# Patient Record
Sex: Male | Born: 1975 | Race: Black or African American | Hispanic: No | Marital: Single | Smoking: Never smoker
Health system: Southern US, Community
[De-identification: ages and names within clinical notes are randomized; demographics above are authoritative.]

## PROBLEM LIST (undated history)

## (undated) DIAGNOSIS — K219 Gastro-esophageal reflux disease without esophagitis: Secondary | ICD-10-CM

## (undated) DIAGNOSIS — G4733 Obstructive sleep apnea (adult) (pediatric): Secondary | ICD-10-CM

## (undated) DIAGNOSIS — I10 Essential (primary) hypertension: Secondary | ICD-10-CM

## (undated) DIAGNOSIS — M5136 Other intervertebral disc degeneration, lumbar region: Secondary | ICD-10-CM

## (undated) DIAGNOSIS — M51369 Other intervertebral disc degeneration, lumbar region without mention of lumbar back pain or lower extremity pain: Secondary | ICD-10-CM

## (undated) HISTORY — DX: Essential (primary) hypertension: I10

## (undated) HISTORY — DX: Other intervertebral disc degeneration, lumbar region: M51.36

## (undated) HISTORY — DX: Other intervertebral disc degeneration, lumbar region without mention of lumbar back pain or lower extremity pain: M51.369

## (undated) HISTORY — DX: Gastro-esophageal reflux disease without esophagitis: K21.9

## (undated) HISTORY — DX: Obstructive sleep apnea (adult) (pediatric): G47.33

---

## 2011-12-24 ENCOUNTER — Ambulatory Visit: Payer: Self-pay | Admitting: Licensed Clinical Social Worker

## 2019-02-13 ENCOUNTER — Encounter (HOSPITAL_COMMUNITY): Payer: Self-pay | Admitting: Emergency Medicine

## 2019-02-13 ENCOUNTER — Other Ambulatory Visit: Payer: Self-pay

## 2019-02-13 ENCOUNTER — Emergency Department (HOSPITAL_COMMUNITY)
Admission: EM | Admit: 2019-02-13 | Discharge: 2019-02-13 | Disposition: A | Discharge status: Home / Self Care | Payer: 59 | Attending: Emergency Medicine | Admitting: Emergency Medicine

## 2019-02-13 ENCOUNTER — Emergency Department (HOSPITAL_COMMUNITY): Payer: 59

## 2019-02-13 DIAGNOSIS — K625 Hemorrhage of anus and rectum: Secondary | ICD-10-CM | POA: Insufficient documentation

## 2019-02-13 DIAGNOSIS — R55 Syncope and collapse: Secondary | ICD-10-CM

## 2019-02-13 DIAGNOSIS — R42 Dizziness and giddiness: Secondary | ICD-10-CM | POA: Diagnosis not present

## 2019-02-13 DIAGNOSIS — Z79899 Other long term (current) drug therapy: Secondary | ICD-10-CM | POA: Insufficient documentation

## 2019-02-13 LAB — CBC WITH DIFFERENTIAL/PLATELET
Abs Immature Granulocytes: 0.04 10*3/uL (ref 0.00–0.07)
Basophils Absolute: 0 10*3/uL (ref 0.0–0.1)
Basophils Relative: 0 %
Eosinophils Absolute: 0.1 10*3/uL (ref 0.0–0.5)
Eosinophils Relative: 1 %
HCT: 41.9 % (ref 39.0–52.0)
Hemoglobin: 13.7 g/dL (ref 13.0–17.0)
Immature Granulocytes: 0 %
Lymphocytes Relative: 22 %
Lymphs Abs: 2 10*3/uL (ref 0.7–4.0)
MCH: 31.1 pg (ref 26.0–34.0)
MCHC: 32.7 g/dL (ref 30.0–36.0)
MCV: 95.2 fL (ref 80.0–100.0)
Monocytes Absolute: 0.7 10*3/uL (ref 0.1–1.0)
Monocytes Relative: 8 %
Neutro Abs: 6.4 10*3/uL (ref 1.7–7.7)
Neutrophils Relative %: 69 %
Platelets: 208 10*3/uL (ref 150–400)
RBC: 4.4 MIL/uL (ref 4.22–5.81)
RDW: 12.5 % (ref 11.5–15.5)
WBC: 9.2 10*3/uL (ref 4.0–10.5)
nRBC: 0 % (ref 0.0–0.2)

## 2019-02-13 LAB — COMPREHENSIVE METABOLIC PANEL
ALT: 35 U/L (ref 0–44)
AST: 28 U/L (ref 15–41)
Albumin: 3.8 g/dL (ref 3.5–5.0)
Alkaline Phosphatase: 61 U/L (ref 38–126)
Anion gap: 9 (ref 5–15)
BUN: 15 mg/dL (ref 6–20)
CO2: 23 mmol/L (ref 22–32)
Calcium: 9.2 mg/dL (ref 8.9–10.3)
Chloride: 105 mmol/L (ref 98–111)
Creatinine, Ser: 1.25 mg/dL — ABNORMAL HIGH (ref 0.61–1.24)
GFR calc Af Amer: >60 mL/min (ref >60)
GFR calc non Af Amer: >60 mL/min (ref >60)
Glucose, Bld: 106 mg/dL — ABNORMAL HIGH (ref 70–99)
Potassium: 4 mmol/L (ref 3.5–5.1)
Sodium: 137 mmol/L (ref 135–145)
Total Bilirubin: 0.4 mg/dL (ref 0.3–1.2)
Total Protein: 6.7 g/dL (ref 6.5–8.1)

## 2019-02-13 LAB — PROTIME-INR
INR: 1.1 (ref 0.8–1.2)
Prothrombin Time: 14.1 seconds (ref 11.4–15.2)

## 2019-02-13 LAB — LIPASE, BLOOD: Lipase: 40 U/L (ref 11–51)

## 2019-02-13 LAB — TYPE AND SCREEN
ABO/RH(D): O POS
Antibody Screen: NEGATIVE

## 2019-02-13 MED ORDER — SODIUM CHLORIDE 0.9 % IV BOLUS
1000.0000 mL | Freq: Once | INTRAVENOUS | Status: AC
  Administered 2019-02-13: 20:00:00 1000 mL via INTRAVENOUS

## 2019-02-13 MED ORDER — SODIUM CHLORIDE 0.9 % IV SOLN: INTRAVENOUS | Status: DC

## 2019-02-13 NOTE — ED Notes (Signed)
Patient given ice chips, ok per MD.

## 2019-02-13 NOTE — Discharge Instructions (Signed)
As discussed, today's evaluation demonstrated generally reassuring labs.  The most likely cause of your episode of passing out is inappropriate stimulation of your vagus nerve: Vasovagal syncope. There is currently no evidence for ongoing strain of your heart, nor abnormal heart rhythm.  It is important that you follow-up with your gastroenterologist tomorrow via telephone to discuss today's emergency department evaluation.  Please discuss this diagnosis, and your labs, the relevant one is listed below. Return here for concerning changes in your condition.  Hemoglobin 13.0 - 17.0 g/dL 13.7

## 2019-02-13 NOTE — ED Provider Notes (Signed)
[facility] DEPT Provider Note   CSN: [id number] Arrival date & time: 02/13/19  1834     History Chief Complaint  Patient presents with  . Rectal Bleeding  . Loss of Consciousness    Travis Turner is a 44 y.o. male.  HPI    Patient presents after an episode of syncope. Notably, patient had colonoscopy performed 2 days ago.  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  He notes that he had at least 10 polyps removed during his procedure, is scheduled to go back for more in a few months.  He is unaware of any pathology reports from this.  He notes that since the procedure he has had episodes of rectal bleeding, both with and without defecation.  Today,, and after a bowel movement the patient had an episode of lightheadedness, subsequently lost consciousness. He woke, with quick return to his faculties, without ongoing bleeding, without abdominal pain.  There may have been some chest pain, none currently, however. He states that he is generally well aside from this family history, takes no medication regularly, has no history of similar syncope. History reviewed. No pertinent past medical history.  There are no problems to display for this patient.   History reviewed. No pertinent surgical history.     No family history on file.  Social History   Tobacco Use  . Smoking status: Not on file  Substance Use Topics  . Alcohol use: Not on file  . Drug use: Not on file    Home Medications Prior to Admission medications   Medication Sig Start Date End Date Taking? Authorizing Provider  bismuth subsalicylate (PEPTO BISMOL) 262 MG/15ML suspension Take 30 mLs by mouth every 6 (six) hours as needed for diarrhea or loose stools.   Yes [provider]  VITAMIN D PO Take 1 capsule by mouth daily.   Yes [provider]    Allergies    Patient has no known allergies.  Review of Systems   Review of Systems  Constitutional:       Per  HPI, otherwise negative  HENT:       Per HPI, otherwise negative  Respiratory:       Per HPI, otherwise negative  Cardiovascular:       Per HPI, otherwise negative  Gastrointestinal: Positive for anal bleeding and blood in stool. Negative for abdominal pain and vomiting.  Endocrine:       Negative aside from HPI  Genitourinary:       Neg aside from HPI   Musculoskeletal:       Per HPI, otherwise negative  Skin: Negative.   Neurological: Positive for syncope. Negative for weakness.    Physical Exam Updated Vital Signs BP 114/75   Pulse (!) 115   Temp 97.9 F (36.6 C) (Oral)   Resp 17   SpO2 100%   Physical Exam Vitals and nursing note reviewed.  Constitutional:      General: He is not in acute distress.    Appearance: He is well-developed.  HENT:     Head: Normocephalic and atraumatic.  Eyes:     Conjunctiva/sclera: Conjunctivae normal.  Cardiovascular:     Rate and Rhythm: Regular rhythm. Tachycardia present.  Pulmonary:     Effort: Pulmonary effort is normal. No respiratory distress.     Breath sounds: No stridor.  Abdominal:     General: There is no distension.     Tenderness: There is no abdominal tenderness. There is no  guarding.  Skin:    General: Skin is warm and dry.  Neurological:     Mental Status: He is alert and oriented to person, place, and time.     ED Results / Procedures / Treatments   Labs (all labs ordered are listed, but only abnormal results are displayed) Labs Reviewed  COMPREHENSIVE METABOLIC PANEL - Abnormal; Notable for the following components:      Result Value   Glucose, Bld 106 (*)    Creatinine, Ser 1.25 (*)    All other components within normal limits  LIPASE, BLOOD  CBC WITH DIFFERENTIAL/PLATELET  PROTIME-INR  TYPE AND SCREEN    EKG EKG Interpretation  Date/Time:  Sunday February 13 2019 19:57:14 EST Ventricular Rate:  104 PR Interval:    QRS Duration: 89 QT Interval:  306 QTC Calculation: 403 R Axis:   74 Text  Interpretation: Sinus tachycardia Abnormal ECG Confirmed by Carmin Muskrat [phone]) on 02/13/2019 8:03:10 PM   Radiology DG Chest Port 1 View  Result Date: 02/13/2019 CLINICAL DATA:  Rectal bleeding.  Syncope. EXAM: PORTABLE CHEST 1 VIEW COMPARISON:  None. FINDINGS: The heart size and mediastinal contours are within normal limits. Both lungs are clear. The visualized skeletal structures are unremarkable. IMPRESSION: No active disease. Electronically Signed   By: Constance Holster M.D.   On: 02/13/2019 20:09    Procedures Procedures (including critical care time)  Medications Ordered in ED Medications  sodium chloride 0.9 % bolus 1,000 mL (1,000 mLs Intravenous New Bag/Given (Non-Interop) 02/13/19 2005)    And  0.9 %  sodium chloride infusion (has no administration in time range)    ED Course  I have reviewed the triage vital signs and the nursing notes.  Pertinent labs & imaging results that were available during my care of the patient were reviewed by me and considered in my medical decision making (see chart for details).    MDM Rules/Calculators/A&P                      9:50 PM On repeat exam the patient is in no distress, awake, alert, sitting upright, speaking clearly, without any increased work of breathing, with no complaints.  We discussed all findings at length, including labs notable for mild elevation in creatinine, no substantial hemoglobin drop, no substantial x-ray abnormalities, reassuring EKG, no evidence for ischemia, arrhythmia.  There is suspicion for vagal episode given the patient's description of syncope, without preceding chest pain, and ongoing mild blood loss from his recent colonoscopy. Absent abdominal pain, fever, leukocytosis, low suspicion for perforation.  No evidence for atypical ACS.  We discussed admission for monitoring versus discharge, the latter to which the patient states that he is comfortable.  Line he understands importance of following up with  his GI doctor, will call tomorrow for appropriate ongoing care. Final Clinical Impression(s) / ED Diagnoses Final diagnoses:  Syncope      Carmin Muskrat, MD 02/13/19 2156

## 2019-02-13 NOTE — ED Triage Notes (Signed)
Per EMS, patient from home, c/o rectal bleeding since last night with BM and syncopal episode today. Colonoscopy on 1/8 with polyp removal.

## 2019-02-14 LAB — ABO/RH: ABO/RH(D): O POS

## 2021-04-17 LAB — HM COLONOSCOPY

## 2021-06-07 ENCOUNTER — Emergency Department (HOSPITAL_COMMUNITY): Payer: 59

## 2021-06-07 ENCOUNTER — Emergency Department (HOSPITAL_COMMUNITY)
Admission: EM | Admit: 2021-06-07 | Discharge: 2021-06-07 | Disposition: A | Discharge status: Home / Self Care | Payer: 59 | Attending: Emergency Medicine | Admitting: Emergency Medicine

## 2021-06-07 DIAGNOSIS — N3001 Acute cystitis with hematuria: Secondary | ICD-10-CM | POA: Diagnosis not present

## 2021-06-07 DIAGNOSIS — N451 Epididymitis: Secondary | ICD-10-CM | POA: Diagnosis not present

## 2021-06-07 DIAGNOSIS — R109 Unspecified abdominal pain: Secondary | ICD-10-CM | POA: Diagnosis present

## 2021-06-07 LAB — CBC WITH DIFFERENTIAL/PLATELET
Abs Immature Granulocytes: 0.03 10*3/uL (ref 0.00–0.07)
Basophils Absolute: 0 10*3/uL (ref 0.0–0.1)
Basophils Relative: 0 %
Eosinophils Absolute: 0.1 10*3/uL (ref 0.0–0.5)
Eosinophils Relative: 1 %
HCT: 48.4 % (ref 39.0–52.0)
Hemoglobin: 16.1 g/dL (ref 13.0–17.0)
Immature Granulocytes: 0 %
Lymphocytes Relative: 13 %
Lymphs Abs: 1.3 10*3/uL (ref 0.7–4.0)
MCH: 31 pg (ref 26.0–34.0)
MCHC: 33.3 g/dL (ref 30.0–36.0)
MCV: 93.1 fL (ref 80.0–100.0)
Monocytes Absolute: 1 10*3/uL (ref 0.1–1.0)
Monocytes Relative: 10 %
Neutro Abs: 7.8 10*3/uL — ABNORMAL HIGH (ref 1.7–7.7)
Neutrophils Relative %: 76 %
Platelets: 233 10*3/uL (ref 150–400)
RBC: 5.2 MIL/uL (ref 4.22–5.81)
RDW: 12.5 % (ref 11.5–15.5)
WBC: 10.3 10*3/uL (ref 4.0–10.5)
nRBC: 0 % (ref 0.0–0.2)

## 2021-06-07 LAB — COMPREHENSIVE METABOLIC PANEL
ALT: 31 U/L (ref 0–44)
AST: 25 U/L (ref 15–41)
Albumin: 3.8 g/dL (ref 3.5–5.0)
Alkaline Phosphatase: 76 U/L (ref 38–126)
Anion gap: 9 (ref 5–15)
BUN: 15 mg/dL (ref 6–20)
CO2: 22 mmol/L (ref 22–32)
Calcium: 9.3 mg/dL (ref 8.9–10.3)
Chloride: 108 mmol/L (ref 98–111)
Creatinine, Ser: 1.37 mg/dL — ABNORMAL HIGH (ref 0.61–1.24)
GFR, Estimated: >60 mL/min (ref >60)
Glucose, Bld: 146 mg/dL — ABNORMAL HIGH (ref 70–99)
Potassium: 3.7 mmol/L (ref 3.5–5.1)
Sodium: 139 mmol/L (ref 135–145)
Total Bilirubin: 0.7 mg/dL (ref 0.3–1.2)
Total Protein: 7.5 g/dL (ref 6.5–8.1)

## 2021-06-07 LAB — URINALYSIS, ROUTINE W REFLEX MICROSCOPIC
Bilirubin Urine: NEGATIVE
Glucose, UA: NEGATIVE mg/dL
Ketones, ur: NEGATIVE mg/dL
Nitrite: NEGATIVE
Protein, ur: 30 mg/dL — AB
RBC / HPF: >50 RBC/hpf — ABNORMAL HIGH (ref 0–5)
Specific Gravity, Urine: 1.02 (ref 1.005–1.030)
WBC, UA: >50 WBC/hpf — ABNORMAL HIGH (ref 0–5)
pH: 6 (ref 5.0–8.0)

## 2021-06-07 LAB — LIPASE, BLOOD: Lipase: 40 U/L (ref 11–51)

## 2021-06-07 MED ORDER — ONDANSETRON 4 MG PO TBDP
4.0000 mg | ORAL_TABLET | Freq: Once | ORAL | Status: AC
  Administered 2021-06-07: 4 mg via ORAL
  Filled 2021-06-07: qty 1

## 2021-06-07 MED ORDER — OXYCODONE-ACETAMINOPHEN 5-325 MG PO TABS
2.0000 | ORAL_TABLET | Freq: Once | ORAL | Status: AC
  Administered 2021-06-07: 2 via ORAL
  Filled 2021-06-07: qty 2

## 2021-06-07 MED ORDER — IOHEXOL 300 MG/ML  SOLN
100.0000 mL | Freq: Once | INTRAMUSCULAR | Status: AC | PRN
Start: 2021-06-07 — End: 2021-06-07
  Administered 2021-06-07: 100 mL via INTRAVENOUS

## 2021-06-07 MED ORDER — CEFTRIAXONE SODIUM 500 MG IJ SOLR
500.0000 mg | Freq: Once | INTRAMUSCULAR | Status: DC
Start: 2021-06-07 — End: 2021-06-07

## 2021-06-07 MED ORDER — DOXYCYCLINE HYCLATE 100 MG PO TABS
100.0000 mg | ORAL_TABLET | Freq: Once | ORAL | Status: AC
  Administered 2021-06-07: 100 mg via ORAL
  Filled 2021-06-07: qty 1

## 2021-06-07 MED ORDER — SODIUM CHLORIDE 0.9 % IV SOLN
2.0000 g | Freq: Once | INTRAVENOUS | Status: AC
  Administered 2021-06-07: 2 g via INTRAVENOUS
  Filled 2021-06-07: qty 20

## 2021-06-07 MED ORDER — DOXYCYCLINE HYCLATE 100 MG PO CAPS: 100.0000 mg | ORAL_CAPSULE | Freq: Two times a day (BID) | ORAL | 0 refills | Status: AC

## 2021-06-07 NOTE — ED Provider Triage Note (Signed)
Emergency Medicine Provider Triage Evaluation Note ? ?Travis Turner , a 46 y.o. male  was evaluated in triage.  Pt complains of groin pain. Report pulling on a lawnmower cord for about 30 min yesterday afternoon.  2 hrs later report severe pain to L groin radiates to L testicle.  Did  not notice any bulge or swelling, no dysuria or hematuria ? ?Review of Systems  ?Positive: As above ?Negative: As above ? ?Physical Exam  ?BP (!) 126/91   Pulse 98   Temp 98.6 ?F (37 ?C) (Oral)   Resp 18   SpO2 92%  ?Gen:   Awake, appears uncomfortable ?Resp:  Normal effort  ?MSK:   Moves extremities without difficulty  ?Other:  Chaperone present.  Exquisite tenderness to L testicle, no swelling noted, no obvious hernia.  ? ?Medical Decision Making  ?Medically screening exam initiated at 11:39 AM.  Appropriate orders placed.  Espen Bethel was informed that the remainder of the evaluation will be completed by another provider, this initial triage assessment does not replace that evaluation, and the importance of remaining in the ED until their evaluation is complete. ? ? ?  ?Domenic Moras, PA-C ?06/07/21 1145 ? ?

## 2021-06-07 NOTE — ED Provider Notes (Signed)
?[facility] ?Provider Note ? ? ?CSN: [id number] ?Arrival date & time: 06/07/21  1053 ? ?  ? ?History ? ?Chief Complaint  ?Patient presents with  ? Abdominal Pain  ? groin  ? Dysuria  ? ? ?Federick Levene is a 46 y.o. male. ? ? ?Abdominal Pain ?Associated symptoms: dysuria   ?Dysuria ?Presenting symptoms: dysuria   ?Associated symptoms: abdominal pain   ? ?Patient is otherwise healthy 46 year old male presents to the emergency department for groin pain that started yesterday.  He then started having left testicular pain.  He report he was pulling his lawnmower cord and then in the afternoon felt worsening left groin pain that radiated to his left testicle.  He denies associated dysuria or hematuria.  Denies associated frequency.  His chief complaint does have dysuria however patient denies it.  He did report hematuria at one point.  In regards to the abdominal pain he reports more of the groin region.  Denies back pain.  Denies fever but does have nausea.  Denies vomiting or diarrhea.  Denies penile discharge.  There is a possibility of STI per patient history.  Otherwise no other complaints. ? ?Home Medications ?Prior to Admission medications   ?Medication Sig Start Date End Date Taking? Authorizing Provider  ?doxycycline (VIBRAMYCIN) 100 MG capsule Take 1 capsule (100 mg total) by mouth 2 (two) times daily for 14 days. 06/07/21 06/21/21 Yes Donnamarie Poag, MD  ?bismuth subsalicylate (PEPTO BISMOL) 262 MG/15ML suspension Take 30 mLs by mouth every 6 (six) hours as needed for diarrhea or loose stools.    [provider]  ?VITAMIN D PO Take 1 capsule by mouth daily.    [provider]  ?   ? ?Allergies    ?Patient has no known allergies.   ? ?Review of Systems   ?Review of Systems  ?Gastrointestinal:  Positive for abdominal pain.  ?Genitourinary:  Positive for dysuria.  ? ?Physical Exam ?Updated Vital Signs ?BP 124/68   Pulse 72   Temp 98.6 ?F (37 ?C) (Oral)    Resp 18   SpO2 95%  ?Physical Exam ?Vitals and nursing note reviewed.  ?Constitutional:   ?   General: He is not in acute distress. ?   Appearance: He is well-developed.  ?HENT:  ?   Head: Normocephalic and atraumatic.  ?Eyes:  ?   Conjunctiva/sclera: Conjunctivae normal.  ?Cardiovascular:  ?   Rate and Rhythm: Normal rate and regular rhythm.  ?   Heart sounds: No murmur heard. ?Pulmonary:  ?   Effort: Pulmonary effort is normal. No respiratory distress.  ?   Breath sounds: Normal breath sounds.  ?Abdominal:  ?   Palpations: Abdomen is soft.  ?   Tenderness: There is no abdominal tenderness.  ?   Hernia: No hernia is present.  ?   Comments: Obvious hernia.  No obvious inguinal hernia.  Patient is very tender near the lower groin region on the left right above his left testicle.  ?Genitourinary: ?   Penis: Normal.   ?   Testes:     ?   Left: Tenderness present.  ?   Comments: High riding left testicle as compared to the right.  No obvious hernia.  There is possible prismatic reflex on the left but not clear on repeat exam. ?Musculoskeletal:     ?   General: No swelling.  ?   Cervical back: Neck supple.  ?Skin: ?   General: Skin is warm and dry.  ?  Capillary Refill: Capillary refill takes less than 2 seconds.  ?Neurological:  ?   Mental Status: He is alert.  ?Psychiatric:     ?   Mood and Affect: Mood normal.  ? ? ?ED Results / Procedures / Treatments   ?Labs ?(all labs ordered are listed, but only abnormal results are displayed) ?Labs Reviewed  ?CBC WITH DIFFERENTIAL/PLATELET - Abnormal; Notable for the following components:  ?    Result Value  ? Neutro Abs 7.8 (*)   ? All other components within normal limits  ?COMPREHENSIVE METABOLIC PANEL - Abnormal; Notable for the following components:  ? Glucose, Bld 146 (*)   ? Creatinine, Ser 1.37 (*)   ? All other components within normal limits  ?URINALYSIS, ROUTINE W REFLEX MICROSCOPIC - Abnormal; Notable for the following components:  ? APPearance CLOUDY (*)   ? Hgb  urine dipstick LARGE (*)   ? Protein, ur 30 (*)   ? Leukocytes,Ua LARGE (*)   ? RBC / HPF >50 (*)   ? WBC, UA >50 (*)   ? Bacteria, UA RARE (*)   ? All other components within normal limits  ?URINE CULTURE  ?LIPASE, BLOOD  ?GC/CHLAMYDIA PROBE AMP () NOT AT [facility]  ? ? ?EKG ?None ? ?Radiology ?CT ABDOMEN PELVIS W CONTRAST ? ?Result Date: 06/07/2021 ?CLINICAL DATA:  LLQ abdominal pain Concern for left testicular pain that radiated to the groin region. Concern for possible hernia or torsion. EXAM: CT ABDOMEN AND PELVIS WITH CONTRAST TECHNIQUE: Multidetector CT imaging of the abdomen and pelvis was performed using the standard protocol following bolus administration of intravenous contrast. RADIATION DOSE REDUCTION: This exam was performed according to the departmental dose-optimization program which includes automated exposure control, adjustment of the mA and/or kV according to patient size and/or use of iterative reconstruction technique. CONTRAST:  164m OMNIPAQUE IOHEXOL 300 MG/ML  SOLN COMPARISON:  Scrotal ultrasound earlier today was reviewed. FINDINGS: Lower chest: Dependent and subsegmental atelectasis within both lower lobes. No significant pleural effusion. Hepatobiliary: 9 mm enhancing focus in the subcapsular right hepatic lobe, series 3, image 11. Tiny subcentimeter hypodensity in the left lobe is too small to characterize. Gallbladder physiologically distended, no calcified stone. No biliary dilatation. Pancreas: No ductal dilatation or inflammation. Spleen: Normal in size without focal abnormality. Adrenals/Urinary Tract: Normal adrenal glands. No hydronephrosis or perinephric edema. Homogeneous renal enhancement no focal renal lesion or renal stone. Urinary bladder is physiologically distended without wall thickening. Stomach/Bowel: Patulous distal esophagus. Unremarkable stomach. No small bowel obstruction or inflammation. Normal appendix. Cecum is high-riding in the right mid abdomen. Small to  moderate volume of colonic stool. There is no colonic wall thickening or pericolonic edema. Vascular/Lymphatic: Normal caliber abdominal aorta. Minimal atherosclerosis. Patent portal vein. No abdominopelvic adenopathy. Reproductive: Unremarkable prostate gland. There is hyperemia and increased vascularity of the left pampiniform plexus in the inguinal canal extending into the left hemiscrotum, suspicious for epididymitis, for example series 3, image 96 and 98. Other: No inguinal hernia.  No ascites or free air. Musculoskeletal: Lower lumbar facet hypertrophy. There are no acute or suspicious osseous abnormalities. 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 IMPRESSION: 1. Hyperemia and increased vascularity of the left pampiniform plexus in the inguinal canal extending into the left hemiscrotum, suspicious for epididymitis. 2. No inguinal hernia. 3. Enhancing 9 mm focus in the subcapsular right hepatic lobe, nonspecific. In a low risk patient this is typically benign and no further follow-up is recommended. In a high-risk patient, recommend follow-up MRI in  3-6 months. This recommendation follows the recommendations of American College of Radiology assessment of incidental liver lesions. Aortic Atherosclerosis (ICD10-I70.0). Electronically Signed   By: Keith Rake M.D.   On: 06/07/2021 17:57  ? ?US SCROTUM W/DOPPLER ? ?Result Date: 06/07/2021 ?CLINICAL DATA:  Left testicular pain. EXAM: SCROTAL ULTRASOUND DOPPLER ULTRASOUND OF THE TESTICLES TECHNIQUE: Complete ultrasound examination of the testicles, epididymis, and other scrotal structures was performed. Color and spectral Doppler ultrasound were also utilized to evaluate blood flow to the testicles. COMPARISON:  None Available. FINDINGS: Right testicle Measurements: 4.1 x 2.6 x 2.5 cm. No mass or microlithiasis visualized. Left testicle Measurements: 3.7 x 2.7 x 2.3 cm. No mass or microlithiasis visualized. Right epididymis:  Normal in size and appearance.  Left epididymis:  5 mm cyst is noted. Hydrocele:  None visualized. Varicocele:  None visualized. Pulsed Doppler interrogation of both testes demonstrates normal low resistance arterial and venous waveforms bil

## 2021-06-07 NOTE — Discharge Instructions (Addendum)
You have been evaluated for left testicular pain. ? ?Your CT is consistent with epididymitis.  Please see the attached learning form to read more about it.  Please take doxycycline for 14 days.  Follow-up with your primary care provider.  In addition your CT did show a 9 mm region on the right hepatic lobe that is unclear at this time.  You will require MRI in 3 to 6 months for further assessment.  Please see your primary care provider for follow-up. ? ?

## 2021-06-07 NOTE — ED Triage Notes (Signed)
EMS stated , pt pulling lawnmower yesterday and felt some pain in his groin and today going up into his stomach. Today hard to urinate. ?

## 2021-06-09 LAB — URINE CULTURE: Culture: NO GROWTH

## 2021-10-31 NOTE — Progress Notes (Signed)
[facility] [place] Phone: [phone] Subjective:   Travis Turner, am serving as a scribe for Dr. Hulan Saas.  I'm seeing this patient by the request  of:  [facility]  CC: Neck pain follow-up  ION:[id number]  Travis Turner is a 46 y.o. male coming in with complaint of neck pain. 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 Laying down and sleeping is the only thing that takes the pain away, but it takes a long time to get comfortable. When he is moving around and working it will get painful and the only thing that helps is stopping what he is doing. Patient was doing aleve and tylenol but got worried about taking too much because it eats the lining of his stomach. Patient has gotten a new chair put his monitor up so he is not looking down, but still around 9 or 10 am the pain will start and eventually he will have to stop. Patient states the pain will start off aching the will eventually get really bad and start throbbing this makes it feel like his head is going to explode.       No past medical history on file. No past surgical history on file. Social History   Socioeconomic History   Marital status: Single    Spouse name: Not on file   Number of children: Not on file   Years of education: Not on file   Highest education level: Not on file  Occupational History   Not on file  Tobacco Use   Smoking status: Not on file   Smokeless tobacco: Not on file  Substance and Sexual Activity   Alcohol use: Not on file   Drug use: Not on file   Sexual activity: Not on file  Other Topics Concern   Not on file  Social History Narrative   Not on file   Social  Determinants of Health   Financial Resource Strain: Not on file  Food Insecurity: Not on file  Transportation Needs: Not on file  Physical Activity: Not on file  Stress: Not on file  Social Connections: Not on file   No Known Allergies No family history on file.       Current Outpatient Medications (Other):    tiZANidine (ZANAFLEX) 2 MG tablet, Take 1 tablet (2 mg total) by mouth at bedtime.   VITAMIN D PO, Take 1 capsule by mouth daily.   bismuth subsalicylate (PEPTO BISMOL) 262 MG/15ML suspension, Take 30 mLs by mouth every 6 (six) hours as needed for diarrhea or loose stools. (Patient not taking: Reported on 11/05/2021)   Reviewed prior external information including notes and imaging from  primary care provider As well as notes that were available from care everywhere and other healthcare systems.  Past medical history, social, surgical and family history all reviewed in electronic medical record.  No pertanent information unless stated regarding to the chief complaint.   Review of Systems:  No headache, visual changes, nausea, vomiting, diarrhea, constipation, dizziness, abdominal pain, skin rash, fevers, chills, night sweats, weight loss, swollen lymph nodes, body aches, joint swelling, chest pain, shortness of breath, mood changes. POSITIVE muscle aches  Objective  Blood pressure (!) 142/98, pulse 76, height 5' 8.75" (1.746 m), weight 189 lb (85.7 kg), SpO2 97 %.   General: No apparent distress alert and oriented x3 mood and affect normal, dressed appropriately.  HEENT: Pupils equal, extraocular movements intact  Respiratory: Patient's speak in full sentences and does not appear short of breath  Cardiovascular: No lower extremity edema, non tender, no erythema  Neck exam does have some limited sidebending.  Lacks last 10 degrees of extension as well.  Mild scapular dyskinesis noted as well.  Negative Spurling's.  5 out of 5 strength noted patient does have a subungual  hematoma   Osteopathic findings C2 flexed rotated and side bent right C4 flexed rotated and side bent left C6 flexed rotated and side bent left T3 extended rotated and side bent right inhaled third rib T5 extended rotated and side bent left  [id number]; 15 additional minutes spent for Therapeutic exercises as stated in above notes.  This included exercises focusing on stretching, strengthening, with significant focus on eccentric aspects.   Long term goals include an improvement in range of motion, strength, endurance as well as avoiding reinjury. Patient's frequency would include in 1-2 times a day, 3-5 times a week for a duration of 6-12 weeks. Exercises that included:  Basic scapular stabilization to include adduction and depression of scapula Scaption, focusing on proper movement and good control Internal and External rotation utilizing a theraband, with elbow tucked at side entire time Rows with theraband    Proper technique shown and discussed handout in great detail with ATC.  All questions were discussed and answered.      Impression and Recommendations:    The above documentation has been reviewed and is accurate and complete Lyndal Pulley, DO

## 2021-11-05 ENCOUNTER — Ambulatory Visit (INDEPENDENT_AMBULATORY_CARE_PROVIDER_SITE_OTHER): Payer: 59

## 2021-11-05 ENCOUNTER — Ambulatory Visit: Payer: 59 | Admitting: Family Medicine

## 2021-11-05 VITALS — BP 142/98 | HR 76 | Ht 68.75 in | Wt 189.0 lb

## 2021-11-05 DIAGNOSIS — M9901 Segmental and somatic dysfunction of cervical region: Secondary | ICD-10-CM | POA: Diagnosis not present

## 2021-11-05 DIAGNOSIS — M542 Cervicalgia: Secondary | ICD-10-CM | POA: Diagnosis not present

## 2021-11-05 DIAGNOSIS — M9908 Segmental and somatic dysfunction of rib cage: Secondary | ICD-10-CM | POA: Diagnosis not present

## 2021-11-05 DIAGNOSIS — M9902 Segmental and somatic dysfunction of thoracic region: Secondary | ICD-10-CM | POA: Diagnosis not present

## 2021-11-05 MED ORDER — TIZANIDINE HCL 2 MG PO TABS: 2.0000 mg | ORAL_TABLET | Freq: Every day | ORAL | 0 refills | Status: DC

## 2021-11-05 NOTE — Assessment & Plan Note (Addendum)
   Decision today to treat with OMT was based on Physical Exam  After verbal consent patient was treated with HVLA, ME, FPR techniques in cervical, thoracic, rib areas, all areas are chronic   Patient tolerated the procedure well with improvement in symptoms  Patient given exercises, stretches and lifestyle modifications  See medications in patient instructions if given  Patient will follow up in 4-8 weeks 

## 2021-11-05 NOTE — Assessment & Plan Note (Signed)
See to be chronic.  Seems to be with some limited sidebending.  Mostly seems to be muscular but may be have some underlying arthritic changes.  We discussed ergonomics.  Muscle relaxer given.  Discussed we could consider anti-inflammatories but patient has had difficulty with stomach issues previously.  Follow-up with me again in 6 to 8 weeks otherwise.

## 2021-11-05 NOTE — Patient Instructions (Addendum)
Good to see you Get x-rays on your way out Tried manipulation today Scapular exercises given Zanaflex 2mg  to take nightly Hands in Periferal vision Letter given for adjustable standing desk  Follow up in 5-6 weeks

## 2021-12-10 NOTE — Progress Notes (Signed)
[facility] [place] Phone: [phone] Subjective:   Rito Ehrlich, am serving as a scribe for Dr. Hulan Saas. I'm seeing this patient by the request  of:  [facility]  CC: Neck and back pain follow-up  [id number]  Kebron Pulse is a 46 y.o. male coming in with complaint of back and neck pain. OMT 11/05/2021. Patient states that OMT helped a little, patient does not try and do a lot each day because after being active for too long the neck pain will start hurting. The only thing that takes the pain away is laying down. Patient will take a muscle relaxer if he has had a long day. Wants to go over x rays.   Medications patient has been prescribed: Zanaflex  Taking:         Reviewed prior external information including notes and imaging from previsou exam, outside providers and external EMR if available.   As well as notes that were available from care everywhere and other healthcare systems.  Past medical history, social, surgical and family history all reviewed in electronic medical record.  No pertanent information unless stated regarding to the chief complaint.   No past medical history on file.  No Known Allergies   Review of Systems:  No headache, visual changes, nausea, vomiting, diarrhea, constipation, dizziness, abdominal pain, skin rash, fevers, chills, night sweats, weight loss, swollen lymph nodes, body aches, joint swelling, chest pain, shortness of breath, mood changes. POSITIVE muscle aches  Objective  Blood pressure (!) 150/100, pulse 78, height 5' 8.75" (1.746 m), weight 184 lb (83.5 kg), SpO2 97 %.   General: No apparent distress alert and oriented x3 mood and affect normal, dressed appropriately.  HEENT: Pupils equal, extraocular movements intact  Respiratory: Patient's speak in full sentences and does not appear short of breath  Cardiovascular: No lower extremity edema, non  tender, no erythema  MSK:  Back does have some loss of lordosis noted.  Neck still has loss of lordosis.  Some limited sidebending bilaterally.  Significant tightness noted.  Patient does have postsurgical changes noted of the left arm  Osteopathic findings  C3 flexed rotated and side bent right C6 flexed rotated and side bent left T3 extended rotated and side bent right inhaled rib T4 extended rotated and side bent left L2 flexed rotated and side bent right Sacrum right on right       Assessment and Plan:  Neck pain Neck pain known arthritic changes.  Doing relatively well overall.  I do believe patient will do better as long as he continues with the scapular strengthening.  Discussed icing regimen and home exercises.  Patient is not taking medications on a regular basis at the moment but we do have room for this if necessary.  Increase activity slowly.  Patient is to monitor her blood pressure on a more regular basis as well and will follow-up with his primary care provider.    Nonallopathic problems  Decision today to treat with OMT was based on Physical Exam  After verbal consent patient was treated with HVLA, ME, FPR techniques in cervical, rib, thoracic, lumbar, and sacral  areas  Patient tolerated the procedure well with improvement in symptoms  Patient given exercises, stretches and lifestyle modifications  See medications in patient instructions if given  Patient will follow up in 4-8 weeks     The above documentation has been reviewed and is accurate and complete Olevia Bowens  Tamala Julian, DO         Note: This dictation was prepared with Dragon dictation along with smaller phrase technology. Any transcriptional errors that result from this process are unintentional.

## 2021-12-12 ENCOUNTER — Ambulatory Visit: Payer: 59 | Admitting: Family Medicine

## 2021-12-12 VITALS — BP 150/100 | HR 78 | Ht 68.75 in | Wt 184.0 lb

## 2021-12-12 DIAGNOSIS — M9904 Segmental and somatic dysfunction of sacral region: Secondary | ICD-10-CM

## 2021-12-12 DIAGNOSIS — M9902 Segmental and somatic dysfunction of thoracic region: Secondary | ICD-10-CM | POA: Diagnosis not present

## 2021-12-12 DIAGNOSIS — M9901 Segmental and somatic dysfunction of cervical region: Secondary | ICD-10-CM | POA: Diagnosis not present

## 2021-12-12 DIAGNOSIS — M9903 Segmental and somatic dysfunction of lumbar region: Secondary | ICD-10-CM

## 2021-12-12 DIAGNOSIS — M9908 Segmental and somatic dysfunction of rib cage: Secondary | ICD-10-CM | POA: Diagnosis not present

## 2021-12-12 DIAGNOSIS — M542 Cervicalgia: Secondary | ICD-10-CM | POA: Diagnosis not present

## 2021-12-12 MED ORDER — TIZANIDINE HCL 2 MG PO TABS: 2.0000 mg | ORAL_TABLET | Freq: Every day | ORAL | 0 refills | Status: DC

## 2021-12-12 NOTE — Patient Instructions (Signed)
Good to see you  Overall I think the neck is going to great Zanaflex refilled Keep up with the exercises See me again in 6-8 weeks

## 2021-12-13 NOTE — Assessment & Plan Note (Addendum)
Neck pain known arthritic changes.  Doing relatively well overall.  I do believe patient will do better as long as he continues with the scapular strengthening.  Discussed icing regimen and home exercises.  Patient is not taking medications on a regular basis at the moment but we do have room for this if necessary and does have a prescription for the Zanaflex 2 mg and is encouraged to take at night..  Increase activity slowly.  Patient is to monitor her blood pressure on a more regular basis as well and will follow-up with his primary care provider.

## 2022-01-23 NOTE — Progress Notes (Signed)
[facility] [address] [place] [address] Phone: [phone] Subjective:   Travis Turner, am serving as a scribe for Dr. Antoine Primas.  I'm seeing this patient by the request  of:  [facility]  CC: Back and neck pain follow-up  [id number]  Travis Turner is a 46 y.o. male coming in with complaint of back and neck pain. OMT 12/12/2021. Patient states that he is feeling about the same as last visit. Does feel like his pain moves. Pain is more in middle of cervical spine with less radiating pain. Tries to take more breaks and rests. Uses mm relaxer on the days when he does a lot throughout the day.   Medications patient has been prescribed: Zanaflex  Taking:         Reviewed prior external information including notes and imaging from previsou exam, outside providers and external EMR if available.   As well as notes that were available from care everywhere and other healthcare systems.  Past medical history, social, surgical and family history all reviewed in electronic medical record.  No pertanent information unless stated regarding to the chief complaint.   No past medical history on file.  No Known Allergies   Review of Systems:  No headache, visual changes, nausea, vomiting, diarrhea, constipation, dizziness, abdominal pain, skin rash, fevers, chills, night sweats, weight loss, swollen lymph nodes, body aches, joint swelling, chest pain, shortness of breath, mood changes. POSITIVE muscle aches  Objective  Blood pressure (!) 132/90, pulse 89, height 5\' 8"  (1.727 m), weight 187 lb (84.8 kg), SpO2 97 %.   General: No apparent distress alert and oriented x3 mood and affect normal, dressed appropriately.  HEENT: Pupils equal, extraocular movements intact  Respiratory: Patient's speak in full sentences and does not appear short of breath  Cardiovascular: No lower extremity edema, non tender, no erythema  Neck exam does have  some loss of lordosis.  Some tightness noted musculature.  Patient does have some limited sidebending bilaterally.  Patient's left arm has healed up very well.  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  Patient does have some degenerative disc disease with seen on x-ray.  We do have different medications that could be beneficial but patient would like to avoid them.  We discussed potential formal physical therapy but patient would like to try to do the home exercises on his own and will consider formal physical therapy.  Attempted osteopathic manipulation today.  Follow-up again in 6 to 8 weeks    Nonallopathic problems  Decision today to treat with OMT was based on Physical Exam  After verbal consent patient was treated with HVLA, ME, FPR techniques in cervical, rib, thoracic,   areas  Patient tolerated the procedure well with improvement in symptoms  Patient given exercises, stretches and lifestyle modifications  See medications in patient instructions if given  Patient will follow up in 4-8 weeks    The above documentation has been reviewed and is accurate and complete , DO          Note: This dictation was prepared with Dragon dictation along with smaller phrase technology. Any transcriptional errors that result from this process are unintentional.

## 2022-01-30 ENCOUNTER — Ambulatory Visit: Payer: 59 | Admitting: Family Medicine

## 2022-01-30 VITALS — BP 132/90 | HR 89 | Ht 68.0 in | Wt 187.0 lb

## 2022-01-30 DIAGNOSIS — M9908 Segmental and somatic dysfunction of rib cage: Secondary | ICD-10-CM

## 2022-01-30 DIAGNOSIS — M9902 Segmental and somatic dysfunction of thoracic region: Secondary | ICD-10-CM

## 2022-01-30 DIAGNOSIS — M9901 Segmental and somatic dysfunction of cervical region: Secondary | ICD-10-CM | POA: Diagnosis not present

## 2022-01-30 DIAGNOSIS — M542 Cervicalgia: Secondary | ICD-10-CM | POA: Diagnosis not present

## 2022-01-30 NOTE — Assessment & Plan Note (Addendum)
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  Patient does have some degenerative disc disease with seen on x-ray.  We do have different medications that could be beneficial but patient would like to avoid them.  We discussed potential formal physical therapy but patient would like to try to do the home exercises on his own and will consider formal physical therapy.  Attempted osteopathic manipulation today.  Follow-up again in 6 to 8 weeks I did discuss the Zanaflex 2 mg at night when needed.

## 2022-01-30 NOTE — Patient Instructions (Addendum)
Great to see you Happy New Year See me again in 6 weeks

## 2022-03-12 NOTE — Progress Notes (Deleted)
  [place] Phone: [phone] Subjective:    I'm seeing this patient by the request  of:  [facility]  CC: back and neck pain   [id number]  Travis Turner is a 47 y.o. male coming in with complaint of back and neck pain. OMT on 01/30/2022. Patient states   Medications patient has been prescribed:   Taking:         Reviewed prior external information including notes and imaging from previsou exam, outside providers and external EMR if available.   As well as notes that were available from care everywhere and other healthcare systems.  Past medical history, social, surgical and family history all reviewed in electronic medical record.  No pertanent information unless stated regarding to the chief complaint.   No past medical history on file.  No Known Allergies   Review of Systems:  No headache, visual changes, nausea, vomiting, diarrhea, constipation, dizziness, abdominal pain, skin rash, fevers, chills, night sweats, weight loss, swollen lymph nodes, body aches, joint swelling, chest pain, shortness of breath, mood changes. POSITIVE muscle aches  Objective  There were no vitals taken for this visit.   General: No apparent distress alert and oriented x3 mood and affect normal, dressed appropriately.  HEENT: Pupils equal, extraocular movements intact  Respiratory: Patient's speak in full sentences and does not appear short of breath  Cardiovascular: No lower extremity edema, non tender, no erythema  Gait MSK:  Back   Osteopathic findings  C2 flexed rotated and side bent right C6 flexed rotated and side bent left T3 extended rotated and side bent right inhaled rib T9 extended rotated and side bent left L2 flexed rotated and side bent right Sacrum right on right       Assessment and Plan:  No problem-specific Assessment & Plan notes found for this encounter.     Nonallopathic problems  Decision today to treat with OMT was based on Physical Exam  After verbal consent patient was treated with HVLA, ME, FPR techniques in cervical, rib, thoracic, lumbar, and sacral  areas  Patient tolerated the procedure well with improvement in symptoms  Patient given exercises, stretches and lifestyle modifications  See medications in patient instructions if given  Patient will follow up in 4-8 weeks     The above documentation has been reviewed and is accurate and complete Lyndal Pulley, DO         Note: This dictation was prepared with Dragon dictation along with smaller phrase technology. Any transcriptional errors that result from this process are unintentional.

## 2022-03-13 ENCOUNTER — Ambulatory Visit: Payer: 59 | Admitting: Family Medicine

## 2022-04-15 ENCOUNTER — Ambulatory Visit: Payer: 59 | Admitting: Internal Medicine

## 2022-04-15 ENCOUNTER — Encounter: Payer: Self-pay | Admitting: Internal Medicine

## 2022-04-15 VITALS — BP 168/98 | HR 76 | Temp 98.0°F | Resp 16 | Ht 68.0 in | Wt 196.0 lb

## 2022-04-15 DIAGNOSIS — I10 Essential (primary) hypertension: Secondary | ICD-10-CM | POA: Diagnosis not present

## 2022-04-15 DIAGNOSIS — Z0001 Encounter for general adult medical examination with abnormal findings: Secondary | ICD-10-CM | POA: Insufficient documentation

## 2022-04-15 DIAGNOSIS — E785 Hyperlipidemia, unspecified: Secondary | ICD-10-CM

## 2022-04-15 LAB — BASIC METABOLIC PANEL
BUN: 14 mg/dL (ref 6–23)
CO2: 28 mEq/L (ref 19–32)
Calcium: 9.8 mg/dL (ref 8.4–10.5)
Chloride: 102 mEq/L (ref 96–112)
Creatinine, Ser: 1.15 mg/dL (ref 0.40–1.50)
GFR: 75.23 mL/min (ref >60.00)
Glucose, Bld: 102 mg/dL — ABNORMAL HIGH (ref 70–99)
Potassium: 3.8 mEq/L (ref 3.5–5.1)
Sodium: 139 mEq/L (ref 135–145)

## 2022-04-15 LAB — HEPATIC FUNCTION PANEL
ALT: 31 U/L (ref 0–53)
AST: 22 U/L (ref 0–37)
Albumin: 4.1 g/dL (ref 3.5–5.2)
Alkaline Phosphatase: 75 U/L (ref 39–117)
Bilirubin, Direct: 0.1 mg/dL (ref 0.0–0.3)
Total Bilirubin: 0.6 mg/dL (ref 0.2–1.2)
Total Protein: 6.9 g/dL (ref 6.0–8.3)

## 2022-04-15 LAB — LIPID PANEL
Cholesterol: 234 mg/dL — ABNORMAL HIGH (ref 0–200)
HDL: 37 mg/dL — ABNORMAL LOW (ref >39.00)
LDL Cholesterol: 169 mg/dL — ABNORMAL HIGH (ref 0–99)
NonHDL: 197
Total CHOL/HDL Ratio: 6
Triglycerides: 141 mg/dL (ref 0.0–149.0)
VLDL: 28.2 mg/dL (ref 0.0–40.0)

## 2022-04-15 LAB — PSA: PSA: 1.61 ng/mL (ref 0.10–4.00)

## 2022-04-15 LAB — TSH: TSH: 1.06 u[IU]/mL (ref 0.35–5.50)

## 2022-04-15 NOTE — Patient Instructions (Signed)
Hypertension, Adult High blood pressure (hypertension) is when the force of blood pumping through the arteries is too strong. The arteries are the blood vessels that carry blood from the heart throughout the body. Hypertension forces the heart to work harder to pump blood and may cause arteries to become narrow or stiff. Untreated or uncontrolled hypertension can lead to a heart attack, heart failure, a stroke, kidney disease, and other problems. A blood pressure reading consists of a higher number over a lower number. Ideally, your blood pressure should be below 120/80. The first ("top") number is called the systolic pressure. It is a measure of the pressure in your arteries as your heart beats. The second ("bottom") number is called the diastolic pressure. It is a measure of the pressure in your arteries as the heart relaxes. What are the causes? The exact cause of this condition is not known. There are some conditions that result in high blood pressure. What increases the risk? Certain factors may make you more likely to develop high blood pressure. Some of these risk factors are under your control, including: Smoking. Not getting enough exercise or physical activity. Being overweight. Having too much fat, sugar, calories, or salt (sodium) in your diet. Drinking too much alcohol. Other risk factors include: Having a personal history of heart disease, diabetes, high cholesterol, or kidney disease. Stress. Having a family history of high blood pressure and high cholesterol. Having obstructive sleep apnea. Age. The risk increases with age. What are the signs or symptoms? High blood pressure may not cause symptoms. Very high blood pressure (hypertensive crisis) may cause: Headache. Fast or irregular heartbeats (palpitations). Shortness of breath. Nosebleed. Nausea and vomiting. Vision changes. Severe chest pain, dizziness, and seizures. How is this diagnosed? This condition is diagnosed by  measuring your blood pressure while you are seated, with your arm resting on a flat surface, your legs uncrossed, and your feet flat on the floor. The cuff of the blood pressure monitor will be placed directly against the skin of your upper arm at the level of your heart. Blood pressure should be measured at least twice using the same arm. Certain conditions can cause a difference in blood pressure between your right and left arms. If you have a high blood pressure reading during one visit or you have normal blood pressure with other risk factors, you may be asked to: Return on a different day to have your blood pressure checked again. Monitor your blood pressure at home for 1 week or longer. If you are diagnosed with hypertension, you may have other blood or imaging tests to help your health care provider understand your overall risk for other conditions. How is this treated? This condition is treated by making healthy lifestyle changes, such as eating healthy foods, exercising more, and reducing your alcohol intake. You may be referred for counseling on a healthy diet and physical activity. Your health care provider may prescribe medicine if lifestyle changes are not enough to get your blood pressure under control and if: Your systolic blood pressure is above 130. Your diastolic blood pressure is above 80. Your personal target blood pressure may vary depending on your medical conditions, your age, and other factors. Follow these instructions at home: Eating and drinking  Eat a diet that is high in fiber and potassium, and low in sodium, added sugar, and fat. An example of this eating plan is called the DASH diet. DASH stands for Dietary Approaches to Stop Hypertension. To eat this way: Eat   plenty of fresh fruits and vegetables. Try to fill one half of your plate at each meal with fruits and vegetables. Eat whole grains, such as whole-wheat pasta, brown rice, or whole-grain bread. Fill about one  fourth of your plate with whole grains. Eat or drink low-fat dairy products, such as skim milk or low-fat yogurt. Avoid fatty cuts of meat, processed or cured meats, and poultry with skin. Fill about one fourth of your plate with lean proteins, such as fish, chicken without skin, beans, eggs, or tofu. Avoid pre-made and processed foods. These tend to be higher in sodium, added sugar, and fat. Reduce your daily sodium intake. Many people with hypertension should eat less than 1,500 mg of sodium a day. Do not drink alcohol if: Your health care provider tells you not to drink. You are pregnant, may be pregnant, or are planning to become pregnant. If you drink alcohol: Limit how much you have to: 0-1 drink a day for women. 0-2 drinks a day for men. Know how much alcohol is in your drink. In the [place] one drink equals one 12 oz bottle of beer (355 mL), one 5 oz glass of wine (148 mL), or one 1 oz glass of hard liquor (44 mL). Lifestyle  Work with your health care provider to maintain a healthy body weight or to lose weight. Ask what an ideal weight is for you. Get at least 30 minutes of exercise that causes your heart to beat faster (aerobic exercise) most days of the week. Activities may include walking, swimming, or biking. Include exercise to strengthen your muscles (resistance exercise), such as Pilates or lifting weights, as part of your weekly exercise routine. Try to do these types of exercises for 30 minutes at least 3 days a week. Do not use any products that contain nicotine or tobacco. These products include cigarettes, chewing tobacco, and vaping devices, such as e-cigarettes. If you need help quitting, ask your health care provider. Monitor your blood pressure at home as told by your health care provider. Keep all follow-up visits. This is important. Medicines Take over-the-counter and prescription medicines only as told by your health care provider. Follow directions carefully. Blood  pressure medicines must be taken as prescribed. Do not skip doses of blood pressure medicine. Doing this puts you at risk for problems and can make the medicine less effective. Ask your health care provider about side effects or reactions to medicines that you should watch for. Contact a health care provider if you: Think you are having a reaction to a medicine you are taking. Have headaches that keep coming back (recurring). Feel dizzy. Have swelling in your ankles. Have trouble with your vision. Get help right away if you: Develop a severe headache or confusion. Have unusual weakness or numbness. Feel faint. Have severe pain in your chest or abdomen. Vomit repeatedly. Have trouble breathing. These symptoms may be an emergency. Get help right away. Call 911. Do not wait to see if the symptoms will go away. Do not drive yourself to the hospital. Summary Hypertension is when the force of blood pumping through your arteries is too strong. If this condition is not controlled, it may put you at risk for serious complications. Your personal target blood pressure may vary depending on your medical conditions, your age, and other factors. For most people, a normal blood pressure is less than 120/80. Hypertension is treated with lifestyle changes, medicines, or a combination of both. Lifestyle changes include losing weight, eating a healthy,   low-sodium diet, exercising more, and limiting alcohol. This information is not intended to replace advice given to you by your health care provider. Make sure you discuss any questions you have with your health care provider. Document Revised: 11/27/2020 Document Reviewed: 11/27/2020 [employer] Patient Education  2023 [employer]  

## 2022-04-15 NOTE — Progress Notes (Unsigned)
Subjective:  Patient ID: Travis Turner, male    DOB: 25-Dec-1975  Age: 47 y.o. MRN: [id number]  CC: Annual Exam, Hypertension, and Hyperlipidemia   HPI [name] presents for a CPX and to establish.  He has a history of hypertension that is not being treated.  He is an active walker and has good endurance.  He denies chest pain, shortness of breath, diaphoresis, or edema.  History [name] has a past medical history of DDD (degenerative disc disease), lumbar, GERD (gastroesophageal reflux disease), Hypertension, and OSA (obstructive sleep apnea).   He has no past surgical history on file.   His family history includes Diabetes in his father; Hypertension in his father and mother.He reports that he has never smoked. He has never been exposed to tobacco smoke. He has never used smokeless tobacco. He reports that he does not currently use alcohol. He reports that he does not currently use drugs.  Outpatient Medications Prior to Visit  Medication Sig Dispense Refill   bismuth subsalicylate (PEPTO BISMOL) 262 MG/15ML suspension Take 30 mLs by mouth every 6 (six) hours as needed for diarrhea or loose stools.     tiZANidine (ZANAFLEX) 2 MG tablet Take 1 tablet (2 mg total) by mouth at bedtime. 90 tablet 0   VITAMIN D PO Take 1 capsule by mouth daily.     No facility-administered medications prior to visit.    ROS Review of Systems  Constitutional:  Negative for chills, diaphoresis, fatigue and fever.  HENT: Negative.    Eyes: Negative.   Respiratory:  Positive for apnea. Negative for cough, chest tightness, shortness of breath and wheezing.   Cardiovascular:  Negative for chest pain, palpitations and leg swelling.  Gastrointestinal:  Negative for abdominal pain, constipation, diarrhea, nausea and vomiting.  Endocrine: Negative.   Genitourinary: Negative.  Negative for difficulty urinating.  Musculoskeletal: Negative.   Skin: Negative.   Neurological: Negative.  Negative for dizziness,  weakness and headaches.  Hematological:  Negative for adenopathy. Does not bruise/bleed easily.  Psychiatric/Behavioral: Negative.      Objective:  BP (!) 168/98 (BP Location: Right Arm, Patient Position: Sitting, Cuff Size: Large)   Pulse 76   Temp 98 F (36.7 C) (Oral)   Resp 16   Ht 5\' 8"  (1.727 m)   Wt 196 lb (88.9 kg)   SpO2 95%   BMI 29.80 kg/m   Physical Exam Vitals reviewed.  Constitutional:      Appearance: Normal appearance. He is not ill-appearing.  HENT:     Nose: Nose normal.     Mouth/Throat:     Mouth: Mucous membranes are moist.  Eyes:     General: No scleral icterus.    Conjunctiva/sclera: Conjunctivae normal.  Cardiovascular:     Rate and Rhythm: Normal rate and regular rhythm.     Heart sounds: No murmur heard.    No gallop.     Comments: EKG- NSR, 74 bpm No LVH or Q waves Pulmonary:     Effort: Pulmonary effort is normal.     Breath sounds: No stridor. No wheezing, rhonchi or rales.  Abdominal:     General: Abdomen is flat.     Palpations: There is no mass.     Tenderness: There is no abdominal tenderness. There is no guarding.     Hernia: No hernia is present. There is no hernia in the left inguinal area or right inguinal area.  Genitourinary:    Pubic Area: No rash.  Penis: Normal and circumcised.      Testes: Normal.     Epididymis:     Right: Normal.     Left: Normal.     Prostate: Normal. Not enlarged, not tender and no nodules present.     Rectum: Normal. Guaiac result negative. No mass, tenderness, anal fissure, external hemorrhoid or internal hemorrhoid. Normal anal tone.  Musculoskeletal:        General: Normal range of motion.     Cervical back: Neck supple.     Right lower leg: No edema.     Left lower leg: No edema.  Lymphadenopathy:     Cervical: No cervical adenopathy.     Lower Body: No right inguinal adenopathy. No left inguinal adenopathy.  Skin:    General: Skin is warm and dry.     Coloration: Skin is not pale.      Findings: No rash.  Neurological:     General: No focal deficit present.     Mental Status: He is alert. Mental status is at baseline.  Psychiatric:        Mood and Affect: Mood normal.        Behavior: Behavior normal.    Lab Results  Component Value Date   WBC 4.8 04/15/2022   HGB 16.0 04/15/2022   HCT 47.8 04/15/2022   PLT 198.0 04/15/2022   GLUCOSE 102 (H) 04/15/2022   CHOL 234 (H) 04/15/2022   TRIG 141.0 04/15/2022   HDL 37.00 (L) 04/15/2022   LDLCALC 169 (H) 04/15/2022   ALT 31 04/15/2022   AST 22 04/15/2022   NA 139 04/15/2022   K 3.8 04/15/2022   CL 102 04/15/2022   CREATININE 1.15 04/15/2022   BUN 14 04/15/2022   CO2 28 04/15/2022   TSH 1.06 04/15/2022   PSA 1.61 04/15/2022   INR 1.1 02/13/2019      Assessment & Plan:   Wang was seen today for annual exam, hypertension and hyperlipidemia.  Diagnoses and all orders for this visit:  Primary hypertension- His blood pressure is not controlled.  EKG is negative for LVH.  Will check labs to screen for secondary causes and endorgan damage.  Will treat with a combination of triamterene and hydrochlorothiazide. -     Basic metabolic panel; Future -     CBC with Differential/Platelet; Future -     TSH; Future -     Urinalysis, Routine w reflex microscopic; Future -     Hepatic function panel; Future -     Aldosterone + renin activity w/ ratio; Future -     EKG 12-Lead -     Aldosterone + renin activity w/ ratio -     Hepatic function panel -     Urinalysis, Routine w reflex microscopic -     TSH -     CBC with Differential/Platelet -     Basic metabolic panel -     triamterene-hydrochlorothiazide (DYAZIDE) 37.5-25 MG capsule; Take 1 each (1 capsule total) by mouth daily.  Encounter for general adult medical examination with abnormal findings- Exam completed, labs reviewed, vaccines reviewed, cancer screenings are up-to-date, patient education was given. -     PSA; Future -     PSA  Dyslipidemia, goal  LDL below 130- Statin is not indicated. -     Lipid panel; Future -     TSH; Future -     Hepatic function panel; Future -     Hepatic function panel -  TSH -     Lipid panel   I am having [name] start on triamterene-hydrochlorothiazide. I am also having him maintain his VITAMIN D PO, bismuth subsalicylate, and tiZANidine.  Meds ordered this encounter  Medications   triamterene-hydrochlorothiazide (DYAZIDE) 37.5-25 MG capsule    Sig: Take 1 each (1 capsule total) by mouth daily.    Dispense:  90 capsule    Refill:  0     Follow-up: Return in about 3 months (around 07/16/2022).  Scarlette Calico, MD

## 2022-04-16 ENCOUNTER — Encounter: Payer: Self-pay | Admitting: Internal Medicine

## 2022-04-16 LAB — CBC WITH DIFFERENTIAL/PLATELET
Basophils Absolute: 0 10*3/uL (ref 0.0–0.1)
Basophils Relative: 0.4 % (ref 0.0–3.0)
Eosinophils Absolute: 0.1 10*3/uL (ref 0.0–0.7)
Eosinophils Relative: 2 % (ref 0.0–5.0)
HCT: 47.8 % (ref 39.0–52.0)
Hemoglobin: 16 g/dL (ref 13.0–17.0)
Lymphocytes Relative: 34 % (ref 12.0–46.0)
Lymphs Abs: 1.6 10*3/uL (ref 0.7–4.0)
MCHC: 33.4 g/dL (ref 30.0–36.0)
MCV: 92 fl (ref 78.0–100.0)
Monocytes Absolute: 0.4 10*3/uL (ref 0.1–1.0)
Monocytes Relative: 7.7 % (ref 3.0–12.0)
Neutro Abs: 2.7 10*3/uL (ref 1.4–7.7)
Neutrophils Relative %: 55.9 % (ref 43.0–77.0)
Platelets: 198 10*3/uL (ref 150.0–400.0)
RBC: 5.19 Mil/uL (ref 4.22–5.81)
RDW: 13.2 % (ref 11.5–15.5)
WBC: 4.8 10*3/uL (ref 4.0–10.5)

## 2022-04-16 LAB — URINALYSIS, ROUTINE W REFLEX MICROSCOPIC
Bilirubin Urine: NEGATIVE
Ketones, ur: NEGATIVE
Leukocytes,Ua: NEGATIVE
Nitrite: NEGATIVE
Specific Gravity, Urine: 1.025 (ref 1.000–1.030)
Urine Glucose: NEGATIVE
Urobilinogen, UA: 0.2 (ref 0.0–1.0)
pH: 6 (ref 5.0–8.0)

## 2022-04-16 MED ORDER — TRIAMTERENE-HCTZ 37.5-25 MG PO CAPS: 1.0000 | ORAL_CAPSULE | Freq: Every day | ORAL | 0 refills | Status: DC

## 2022-04-18 ENCOUNTER — Encounter: Payer: Self-pay | Admitting: Internal Medicine

## 2022-04-29 LAB — ALDOSTERONE + RENIN ACTIVITY W/ RATIO
ALDO / PRA Ratio: 1.5 Ratio (ref 0.9–28.9)
Aldosterone: 3 ng/dL
Renin Activity: 2.03 ng/mL/h (ref 0.25–5.82)

## 2022-07-14 ENCOUNTER — Encounter: Payer: Self-pay | Admitting: Internal Medicine

## 2022-07-14 ENCOUNTER — Ambulatory Visit: Payer: 59 | Admitting: Internal Medicine

## 2022-07-14 ENCOUNTER — Other Ambulatory Visit: Payer: Self-pay | Admitting: Internal Medicine

## 2022-07-14 VITALS — BP 138/84 | HR 89 | Temp 98.3°F | Resp 16 | Ht 68.0 in | Wt 198.0 lb

## 2022-07-14 DIAGNOSIS — I10 Essential (primary) hypertension: Secondary | ICD-10-CM

## 2022-07-14 DIAGNOSIS — Z114 Encounter for screening for human immunodeficiency virus [HIV]: Secondary | ICD-10-CM

## 2022-07-14 DIAGNOSIS — R310 Gross hematuria: Secondary | ICD-10-CM | POA: Diagnosis not present

## 2022-07-14 DIAGNOSIS — Z1159 Encounter for screening for other viral diseases: Secondary | ICD-10-CM

## 2022-07-14 LAB — BASIC METABOLIC PANEL
BUN: 14 mg/dL (ref 6–23)
CO2: 25 mEq/L (ref 19–32)
Calcium: 9.8 mg/dL (ref 8.4–10.5)
Chloride: 104 mEq/L (ref 96–112)
Creatinine, Ser: 1.36 mg/dL (ref 0.40–1.50)
GFR: 61.41 mL/min (ref >60.00)
Glucose, Bld: 82 mg/dL (ref 70–99)
Potassium: 3.8 mEq/L (ref 3.5–5.1)
Sodium: 140 mEq/L (ref 135–145)

## 2022-07-14 LAB — URINALYSIS, ROUTINE W REFLEX MICROSCOPIC
Bilirubin Urine: NEGATIVE
Ketones, ur: NEGATIVE
Leukocytes,Ua: NEGATIVE
Nitrite: NEGATIVE
Specific Gravity, Urine: 1.02 (ref 1.000–1.030)
Total Protein, Urine: 30 — AB
Urine Glucose: NEGATIVE
Urobilinogen, UA: 0.2 (ref 0.0–1.0)
pH: 6 (ref 5.0–8.0)

## 2022-07-14 NOTE — Patient Instructions (Signed)

## 2022-07-14 NOTE — Progress Notes (Unsigned)
Subjective:  Patient ID: Travis Turner, male    DOB: 1976-01-31  Age: 47 y.o. MRN: [id number]  CC: Hypertension   HPI Travis Turner presents for f/up ---  About 3 weeks ago he had an episode of gross, painless hematuria.  He was seen elsewhere and a UA was positive for 50 red blood cells.  He has had no recurrences.  Outpatient Medications Prior to Visit  Medication Sig Dispense Refill   bismuth subsalicylate (PEPTO BISMOL) 262 MG/15ML suspension Take 30 mLs by mouth every 6 (six) hours as needed for diarrhea or loose stools.     tiZANidine (ZANAFLEX) 2 MG tablet Take 1 tablet (2 mg total) by mouth at bedtime. 90 tablet 0   triamterene-hydrochlorothiazide (DYAZIDE) 37.5-25 MG capsule TAKE 1 EACH (1 CAPSULE TOTAL) BY MOUTH DAILY. 90 capsule 0   VITAMIN D PO Take 1 capsule by mouth daily.     No facility-administered medications prior to visit.    ROS Review of Systems  Constitutional: Negative.  Negative for diaphoresis and fatigue.  HENT: Negative.    Eyes: Negative.   Respiratory:  Negative for cough, chest tightness, shortness of breath and wheezing.   Cardiovascular:  Negative for chest pain, palpitations and leg swelling.  Gastrointestinal:  Negative for abdominal pain, constipation, diarrhea, nausea and vomiting.  Endocrine: Negative.   Genitourinary:  Positive for hematuria. Negative for difficulty urinating, dysuria, flank pain, frequency, penile discharge, scrotal swelling and testicular pain.  Musculoskeletal: Negative.   Skin: Negative.   Neurological: Negative.  Negative for dizziness and weakness.  Hematological:  Negative for adenopathy. Does not bruise/bleed easily.  Psychiatric/Behavioral: Negative.      Objective:  BP 138/84 (BP Location: Right Arm, Patient Position: Sitting, Cuff Size: Large)   Pulse 89   Temp 98.3 F (36.8 C) (Oral)   Resp 16   Ht 5\' 8"  (1.727 m)   Wt 198 lb (89.8 kg)   SpO2 95%   BMI 30.11 kg/m   BP Readings from Last 3 Encounters:   07/14/22 138/84  04/15/22 (!) 168/98  01/30/22 (!) 132/90    Wt Readings from Last 3 Encounters:  07/14/22 198 lb (89.8 kg)  04/15/22 196 lb (88.9 kg)  01/30/22 187 lb (84.8 kg)    Physical Exam Vitals reviewed.  HENT:     Mouth/Throat:     Mouth: Mucous membranes are moist.  Eyes:     General: No scleral icterus.    Conjunctiva/sclera: Conjunctivae normal.  Cardiovascular:     Rate and Rhythm: Regular rhythm.     Heart sounds: No murmur heard. Pulmonary:     Effort: Pulmonary effort is normal.     Breath sounds: No stridor. No wheezing, rhonchi or rales.  Abdominal:     General: Abdomen is flat.     Palpations: There is no mass.     Tenderness: There is no abdominal tenderness. There is no guarding.     Hernia: No hernia is present.  Musculoskeletal:        General: Normal range of motion.     Cervical back: Neck supple.     Right lower leg: No edema.     Left lower leg: No edema.  Lymphadenopathy:     Cervical: No cervical adenopathy.  Skin:    General: Skin is warm and dry.  Neurological:     General: No focal deficit present.     Mental Status: He is alert. Mental status is at baseline.  Psychiatric:  Mood and Affect: Mood normal.        Behavior: Behavior normal.     Lab Results  Component Value Date   WBC 4.8 04/15/2022   HGB 16.0 04/15/2022   HCT 47.8 04/15/2022   PLT 198.0 04/15/2022   GLUCOSE 82 07/14/2022   CHOL 234 (H) 04/15/2022   TRIG 141.0 04/15/2022   HDL 37.00 (L) 04/15/2022   LDLCALC 169 (H) 04/15/2022   ALT 31 04/15/2022   AST 22 04/15/2022   NA 140 07/14/2022   K 3.8 07/14/2022   CL 104 07/14/2022   CREATININE 1.36 07/14/2022   BUN 14 07/14/2022   CO2 25 07/14/2022   TSH 1.06 04/15/2022   PSA 1.61 04/15/2022   INR 1.1 02/13/2019    CT ABDOMEN PELVIS W CONTRAST  Result Date: 06/07/2021 CLINICAL DATA:  LLQ abdominal pain Concern for left testicular pain that radiated to the groin region. Concern for possible hernia or  torsion. EXAM: CT ABDOMEN AND PELVIS WITH CONTRAST TECHNIQUE: Multidetector CT imaging of the abdomen and pelvis was performed using the standard protocol following bolus administration of intravenous contrast. RADIATION DOSE REDUCTION: This exam was performed according to the departmental dose-optimization program which includes automated exposure control, adjustment of the mA and/or kV according to patient size and/or use of iterative reconstruction technique. CONTRAST:  OMNIPAQUE IOHEXOL 300 MG/ML  SOLN COMPARISON:  Scrotal ultrasound earlier today was reviewed. FINDINGS: Lower chest: Dependent and subsegmental atelectasis within both lower lobes. No significant pleural effusion. Hepatobiliary: 9 mm enhancing focus in the subcapsular right hepatic lobe, series 3, image 11. Tiny subcentimeter hypodensity in the left lobe is too small to characterize. Gallbladder physiologically distended, no calcified stone. No biliary dilatation. Pancreas: No ductal dilatation or inflammation. Spleen: Normal in size without focal abnormality. Adrenals/Urinary Tract: Normal adrenal glands. No hydronephrosis or perinephric edema. Homogeneous renal enhancement no focal renal lesion or renal stone. Urinary bladder is physiologically distended without wall thickening. Stomach/Bowel: Patulous distal esophagus. Unremarkable stomach. No small bowel obstruction or inflammation. Normal appendix. Cecum is high-riding in the right mid abdomen. Small to moderate volume of colonic stool. There is no colonic wall thickening or pericolonic edema. Vascular/Lymphatic: Normal caliber abdominal aorta. Minimal atherosclerosis. Patent portal vein. No abdominopelvic adenopathy. Reproductive: Unremarkable prostate gland. There is hyperemia and increased vascularity of the left pampiniform plexus in the inguinal canal extending into the left hemiscrotum, suspicious for epididymitis, for example series 3, image 96 and 98. Other: No inguinal hernia.   No ascites or free air. Musculoskeletal: Lower lumbar facet hypertrophy. There are no acute or suspicious osseous abnormalities. 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 IMPRESSION: 1. Hyperemia and increased vascularity of the left pampiniform plexus in the inguinal canal extending into the left hemiscrotum, suspicious for epididymitis. 2. No inguinal hernia. 3. Enhancing 9 mm focus in the subcapsular right hepatic lobe, nonspecific. In a low risk patient this is typically benign and no further follow-up is recommended. In a high-risk patient, recommend follow-up MRI in 3-6 months. This recommendation follows the recommendations of [employer] assessment of incidental liver lesions. Aortic Atherosclerosis (ICD10-I70.0). Electronically Signed   By: Narda Rutherford M.D.   On: 06/07/2021 17:57   US SCROTUM W/DOPPLER  Result Date: 06/07/2021 CLINICAL DATA:  Left testicular pain. EXAM: SCROTAL ULTRASOUND DOPPLER ULTRASOUND OF THE TESTICLES TECHNIQUE: Complete ultrasound examination of the testicles, epididymis, and other scrotal structures was performed. Color and spectral Doppler ultrasound were also utilized to evaluate blood flow to the testicles.  COMPARISON:  None Available. FINDINGS: Right testicle Measurements: 4.1 x 2.6 x 2.5 cm. No mass or microlithiasis visualized. Left testicle Measurements: 3.7 x 2.7 x 2.3 cm. No mass or microlithiasis visualized. Right epididymis:  Normal in size and appearance. Left epididymis:  5 mm cyst is noted. Hydrocele:  None visualized. Varicocele:  None visualized. Pulsed Doppler interrogation of both testes demonstrates normal low resistance arterial and venous waveforms bilaterally. IMPRESSION: No evidence of testicular mass or torsion is noted. 5 mm left epididymal cyst. Electronically Signed   By: Lupita Raider M.D.   On: 06/07/2021 12:54    Assessment & Plan:   Gross hematuria- Will evaluate for renal pathology (RCC and cysts) with a renal CT  with contrast. -     Urinalysis, Routine w reflex microscopic; Future -     CT RENAL STONE STUDY; Future  Primary hypertension- His blood pressure is well-controlled. -     Basic metabolic panel; Future -     Urinalysis, Routine w reflex microscopic; Future  Encounter for screening for HIV -     HIV Antibody (routine testing w rflx); Future  Need for hepatitis C screening test -     Hepatitis C antibody; Future     Follow-up: Return in about 3 months (around 10/14/2022).  Sanda Linger, MD

## 2022-07-15 LAB — HEPATITIS C ANTIBODY: Hepatitis C Ab: NONREACTIVE

## 2022-07-15 LAB — HIV ANTIBODY (ROUTINE TESTING W REFLEX): HIV 1&2 Ab, 4th Generation: NONREACTIVE

## 2022-08-03 ENCOUNTER — Encounter: Payer: Self-pay | Admitting: Emergency Medicine

## 2022-08-03 ENCOUNTER — Ambulatory Visit (INDEPENDENT_AMBULATORY_CARE_PROVIDER_SITE_OTHER): Payer: 59

## 2022-08-03 ENCOUNTER — Other Ambulatory Visit: Payer: Self-pay

## 2022-08-03 ENCOUNTER — Ambulatory Visit
Admission: EM | Admit: 2022-08-03 | Discharge: 2022-08-03 | Disposition: A | Discharge status: Home / Self Care | Payer: 59 | Attending: Internal Medicine | Admitting: Internal Medicine

## 2022-08-03 DIAGNOSIS — M79675 Pain in left toe(s): Secondary | ICD-10-CM | POA: Diagnosis not present

## 2022-08-03 MED ORDER — PREDNISONE 20 MG PO TABS: 40.0000 mg | ORAL_TABLET | Freq: Every day | ORAL | 0 refills | Status: AC

## 2022-08-03 NOTE — ED Triage Notes (Signed)
Pt here for left foot pain.  Pain present for 1.5 weeks.  Did report getting a cut on that foot but denies injury. No redness noted.  Swelling noted at great toe proximal joint.  No hx gout.

## 2022-08-03 NOTE — ED Provider Notes (Signed)
[facility]    CSN: [id number] Arrival date & time: 08/03/22  1058      History   Chief Complaint No chief complaint on file.   HPI Travis Turner is a 47 y.o. male.   Patient presents with left foot pain along the proximal portion of the left great toe.  He denies any injury to the area.  Reports that pain has been present for about 1.5 to 2 weeks.  He does state that he cut his toe on a piece of glass but pain is not present in that area.  He is not sure of his last tetanus vaccine.  Has taken Aleve for symptoms with minimal improvement.  Denies history of gout or arthritis of the foot. Denies any fever.      Past Medical History:  Diagnosis Date   DDD (degenerative disc disease), lumbar    GERD (gastroesophageal reflux disease)    Hypertension    OSA (obstructive sleep apnea)     Patient Active Problem List   Diagnosis Date Noted   Gross hematuria 07/14/2022   Primary hypertension 04/15/2022   Encounter for general adult medical examination with abnormal findings 04/15/2022   Dyslipidemia, goal LDL below 130 04/15/2022   Somatic dysfunction of spine, cervical 11/05/2021    History reviewed. No pertinent surgical history.     Home Medications    Prior to Admission medications   Medication Sig Start Date End Date Taking? Authorizing Provider  predniSONE (DELTASONE) 20 MG tablet Take 2 tablets (40 mg total) by mouth daily for 5 days. 08/03/22 08/08/22 Yes Carleigh Buccieri, Acie Fredrickson, FNP  bismuth subsalicylate (PEPTO BISMOL) 262 MG/15ML suspension Take 30 mLs by mouth every 6 (six) hours as needed for diarrhea or loose stools.    [provider]  tiZANidine (ZANAFLEX) 2 MG tablet Take 1 tablet (2 mg total) by mouth at bedtime. 12/12/21   Judi Saa, DO  triamterene-hydrochlorothiazide (DYAZIDE) 37.5-25 MG capsule TAKE 1 EACH (1 CAPSULE TOTAL) BY MOUTH DAILY. 07/14/22   Etta Grandchild, MD  VITAMIN D PO Take 1 capsule by mouth daily.    [provider]    Family History Family History  Problem Relation Age of Onset   Hypertension Mother    Diabetes Father    Hypertension Father     Social History Social History   Tobacco Use   Smoking status: Never    Passive exposure: Never   Smokeless tobacco: Never  Substance Use Topics   Alcohol use: Not Currently   Drug use: Not Currently     Allergies   Patient has no known allergies.   Review of Systems Review of Systems Per HPI  Physical Exam Triage Vital Signs ED Triage Vitals  Enc Vitals Group     BP 08/03/22 1101 128/84     Pulse Rate 08/03/22 1101 74     Resp 08/03/22 1101 18     Temp 08/03/22 1101 98 F (36.7 C)     Temp Source 08/03/22 1101 Oral     SpO2 08/03/22 1101 96 %     Weight 08/03/22 1102 192 lb (87.1 kg)     Height 08/03/22 1102 5\' 9"  (1.753 m)     Head Circumference --      Peak Flow --      Pain Score 08/03/22 1102 8     Pain Loc --      Pain Edu? --      Excl. in GC? --  No data found.  Updated Vital Signs BP 128/84   Pulse 74   Temp 98 F (36.7 C) (Oral)   Resp 18   Ht 5\' 9"  (1.753 m)   Wt 192 lb (87.1 kg)   SpO2 96%   BMI 28.35 kg/m   Visual Acuity Right Eye Distance:   Left Eye Distance:   Bilateral Distance:    Right Eye Near:   Left Eye Near:    Bilateral Near:     Physical Exam Constitutional:      General: He is not in acute distress.    Appearance: Normal appearance. He is not toxic-appearing or diaphoretic.  HENT:     Head: Normocephalic and atraumatic.  Eyes:     Extraocular Movements: Extraocular movements intact.     Conjunctiva/sclera: Conjunctivae normal.  Pulmonary:     Effort: Pulmonary effort is normal.  Feet:     Comments: Patient has tenderness to palpation to left MTP joint of the left great toe with mild swelling.  No warmth or obvious discoloration noted.  Patient does have a healing approximately 0.5 inch linear laceration present to the medial portion of the left great toe with no  surrounding erythema or purulent drainage.  Patient can wiggle toe but has pain with range of motion.  Capillary refill and pulses intact.  No other tenderness to remainder of foot or toes. Neurological:     General: No focal deficit present.     Mental Status: He is alert and oriented to person, place, and time. Mental status is at baseline.  Psychiatric:        Mood and Affect: Mood normal.        Behavior: Behavior normal.        Thought Content: Thought content normal.        Judgment: Judgment normal.      UC Treatments / Results  Labs (all labs ordered are listed, but only abnormal results are displayed) Labs Reviewed  URIC ACID    EKG   Radiology DG Foot Complete Left  Result Date: 08/03/2022 CLINICAL DATA:  Left foot pain for 1.5 weeks. Patient reports laceration to the foot but denies injury. No redness noted. Swelling noted at the great toe proximal joint. No history of gout. EXAM: LEFT FOOT - COMPLETE 3+ VIEW COMPARISON:  None Available. FINDINGS: There is no evidence of fracture or dislocation. There is no evidence of significant arthropathy or cortical erosions. Tiny plantar calcaneal enthesophyte. Soft tissues are unremarkable. IMPRESSION: Negative. Electronically Signed   By: Sherron Ales M.D.   On: 08/03/2022 11:53    Procedures Procedures (including critical care time)  Medications Ordered in UC Medications - No data to display  Initial Impression / Assessment and Plan / UC Course  I have reviewed the triage vital signs and the nursing notes.  Pertinent labs & imaging results that were available during my care of the patient were reviewed by me and considered in my medical decision making (see chart for details).     X-ray of foot was negative for any acute bony abnormality.  I highly suspect gout given patient's physical exam.  No concern for cellulitis related to laceration or septic joint at this time.  Will treat with prednisone.  No obvious  contraindications to steroid therapy noted in patient's history.  Uric acid level pending.  Advised patient to avoid high purine foods.  Laceration appears to be healing well so there is no need for closure at this time  as it occurred multiple days ago.  Recommended updating tetanus vaccine but patient declined this.  Advised strict follow-up if any symptoms persist or worsen.  Patient verbalized understanding and was agreeable with plan. Final Clinical Impressions(s) / UC Diagnoses   Final diagnoses:  Great toe pain, left     Discharge Instructions      Suspect that you have gout.  I have prescribed prednisone steroid to help alleviate this.  Take this with food to avoid stomach upset.  Please avoid high purine foods and these instructions have been attached to your discharge papers.  Uric acid levels with your blood work is pending.  Will call if it is abnormal.  Follow-up if any symptoms are persistent.     ED Prescriptions     Medication Sig Dispense Auth. Provider   predniSONE (DELTASONE) 20 MG tablet Take 2 tablets (40 mg total) by mouth daily for 5 days. 10 tablet Gustavus Bryant, [place]      PDMP not reviewed this encounter.   Gustavus Bryant, [place] 08/03/22 1250

## 2022-08-03 NOTE — Discharge Instructions (Signed)
Suspect that you have gout.  I have prescribed prednisone steroid to help alleviate this.  Take this with food to avoid stomach upset.  Please avoid high purine foods and these instructions have been attached to your discharge papers.  Uric acid levels with your blood work is pending.  Will call if it is abnormal.  Follow-up if any symptoms are persistent.

## 2022-08-04 LAB — URIC ACID: Uric Acid: 8.7 mg/dL — ABNORMAL HIGH (ref 3.8–8.4)

## 2022-08-05 ENCOUNTER — Encounter: Payer: Self-pay | Admitting: Emergency Medicine

## 2022-08-05 DIAGNOSIS — M109 Gout, unspecified: Secondary | ICD-10-CM | POA: Insufficient documentation

## 2022-08-18 ENCOUNTER — Other Ambulatory Visit: Payer: 59

## 2022-10-29 ENCOUNTER — Other Ambulatory Visit: Payer: Self-pay | Admitting: Internal Medicine

## 2022-10-29 DIAGNOSIS — I10 Essential (primary) hypertension: Secondary | ICD-10-CM

## 2023-08-03 ENCOUNTER — Other Ambulatory Visit: Payer: Self-pay | Admitting: Internal Medicine

## 2023-08-03 DIAGNOSIS — I10 Essential (primary) hypertension: Secondary | ICD-10-CM

## 2023-09-01 ENCOUNTER — Other Ambulatory Visit: Payer: Self-pay | Admitting: Internal Medicine

## 2023-09-01 DIAGNOSIS — I10 Essential (primary) hypertension: Secondary | ICD-10-CM

## 2023-09-06 ENCOUNTER — Other Ambulatory Visit: Payer: Self-pay | Admitting: Internal Medicine

## 2023-09-06 DIAGNOSIS — I10 Essential (primary) hypertension: Secondary | ICD-10-CM

## 2023-09-09 NOTE — Telephone Encounter (Signed)
 Last OV 07/14/22 Next OV not scheduled  Pt needs OV, has not been seen in > 1 year.

## 2023-12-22 ENCOUNTER — Encounter: Payer: Self-pay | Admitting: Internal Medicine

## 2023-12-22 ENCOUNTER — Ambulatory Visit (INDEPENDENT_AMBULATORY_CARE_PROVIDER_SITE_OTHER): Payer: Self-pay | Admitting: Internal Medicine

## 2023-12-22 VITALS — BP 142/104 | HR 86 | Temp 97.9°F | Resp 16 | Ht 69.0 in | Wt 196.0 lb

## 2023-12-22 DIAGNOSIS — E785 Hyperlipidemia, unspecified: Secondary | ICD-10-CM

## 2023-12-22 DIAGNOSIS — M9901 Segmental and somatic dysfunction of cervical region: Secondary | ICD-10-CM

## 2023-12-22 DIAGNOSIS — M1A0790 Idiopathic chronic gout, unspecified ankle and foot, without tophus (tophi): Secondary | ICD-10-CM

## 2023-12-22 DIAGNOSIS — R310 Gross hematuria: Secondary | ICD-10-CM

## 2023-12-22 DIAGNOSIS — Z0001 Encounter for general adult medical examination with abnormal findings: Secondary | ICD-10-CM

## 2023-12-22 DIAGNOSIS — Z125 Encounter for screening for malignant neoplasm of prostate: Secondary | ICD-10-CM

## 2023-12-22 DIAGNOSIS — I10 Essential (primary) hypertension: Secondary | ICD-10-CM

## 2023-12-22 MED ORDER — TIZANIDINE HCL 2 MG PO TABS: 2.0000 mg | ORAL_TABLET | Freq: Three times a day (TID) | ORAL | 0 refills | Status: AC | PRN

## 2023-12-22 NOTE — Progress Notes (Signed)
 Subjective:  Patient ID: Travis Turner, male    DOB: August 13, 1975  Age: 48 y.o. MRN: [id number]  CC: Annual Exam and Hypertension   HPI Travis Turner presents for a CPX and f/up ---    Discussed the use of AI scribe software for clinical note transcription with the patient, who gave verbal consent to proceed.  History of Present Illness Travis Turner is a 48 year old male with a history of gout and hypertension who presents for an annual physical exam and management of a recent gout flare.  He experienced a gout flare in October, affecting the base of both big toes and the top of the bone. The pain persists slightly, but he can now wear shoes. He has not taken any specific gout medications due to lack of insurance but has managed symptoms with Tylenol  and Aleve. He has also modified his diet by eliminating red meat, fish, and processed meats, and increased water intake.  He has a history of neck pain, which occasionally radiates down his right arm, causing weakness. This occurs sporadically, particularly during physical activities like yard work. He has not experienced any recent flare-ups in his neck.  No symptoms of high blood pressure such as headaches, blurred vision, chest pain, or shortness of breath. He is currently on blood pressure medication and water pills, which he reports are effective.  His family history is notable for his father having strokes since his last visit.     Outpatient Medications Prior to Visit  Medication Sig Dispense Refill   VITAMIN D PO Take 1 capsule by mouth daily.     bismuth subsalicylate (PEPTO BISMOL) 262 MG/15ML suspension Take 30 mLs by mouth every 6 (six) hours as needed for diarrhea or loose stools.     tiZANidine  (ZANAFLEX ) 2 MG tablet Take 1 tablet (2 mg total) by mouth at bedtime. 90 tablet 0   triamterene -hydrochlorothiazide (DYAZIDE) 37.5-25 MG capsule Take 1 each (1 capsule total) by mouth daily. TAKE 1 EACH (1 CAPSULE TOTAL) BY MOUTH DAILY.  Patient to follow up with PCP prior to future refills 30 capsule 0   No facility-administered medications prior to visit.    ROS Review of Systems  Constitutional:  Negative for appetite change, chills, diaphoresis, fatigue and fever.  HENT: Negative.    Eyes: Negative.   Respiratory: Negative.  Negative for cough, chest tightness, shortness of breath and wheezing.   Cardiovascular:  Negative for chest pain, palpitations and leg swelling.  Gastrointestinal: Negative.  Negative for abdominal pain, constipation, diarrhea, nausea and vomiting.  Genitourinary: Negative.  Negative for difficulty urinating and dysuria.  Musculoskeletal:  Positive for arthralgias and neck pain. Negative for joint swelling, myalgias and neck stiffness.  Skin: Negative.   Neurological:  Negative for dizziness, weakness and light-headedness.  Hematological:  Negative for adenopathy. Does not bruise/bleed easily.  Psychiatric/Behavioral: Negative.      Objective:  BP (!) 142/104 (BP Location: Left Arm, Patient Position: Sitting) Comment: BP (R) 132/104  Pulse 86   Temp 97.9 F (36.6 C) (Temporal)   Resp 16   Ht 5' 9 (1.753 m)   Wt 196 lb (88.9 kg)   SpO2 96%   BMI 28.94 kg/m   BP Readings from Last 3 Encounters:  12/22/23 (!) 142/104  08/03/22 128/84  07/14/22 138/84    Wt Readings from Last 3 Encounters:  12/22/23 196 lb (88.9 kg)  08/03/22 192 lb (87.1 kg)  07/14/22 198 lb (89.8 kg)    Physical Exam Vitals  reviewed.  Constitutional:      Appearance: Normal appearance.  HENT:     Nose: Nose normal.     Mouth/Throat:     Mouth: Mucous membranes are moist.  Eyes:     General: No scleral icterus.    Conjunctiva/sclera: Conjunctivae normal.  Cardiovascular:     Rate and Rhythm: Normal rate and regular rhythm.     Heart sounds: No murmur heard.    No friction rub. No gallop.     Comments: EKG--- NSR, 73 bpm No LVH, Q waves, or ST/T wave changes  Pulmonary:     Effort: Pulmonary  effort is normal.     Breath sounds: No stridor. No wheezing, rhonchi or rales.  Abdominal:     General: Abdomen is flat.     Palpations: There is no mass.     Tenderness: There is no abdominal tenderness. There is no guarding.     Hernia: No hernia is present. There is no hernia in the left inguinal area or right inguinal area.  Genitourinary:    Pubic Area: No rash.      Penis: Normal and circumcised.      Testes: Normal.     Epididymis:     Right: Normal.     Left: Normal.     Prostate: Enlarged. Not tender and no nodules present.     Rectum: Normal. Guaiac result negative. No mass, tenderness, anal fissure, external hemorrhoid or internal hemorrhoid. Normal anal tone.  Musculoskeletal:        General: Normal range of motion.     Cervical back: Normal range of motion and neck supple.     Right lower leg: No edema.     Left lower leg: No edema.  Lymphadenopathy:     Lower Body: No right inguinal adenopathy. No left inguinal adenopathy.  Skin:    General: Skin is warm and dry.  Neurological:     General: No focal deficit present.     Mental Status: He is alert. Mental status is at baseline.  Psychiatric:        Mood and Affect: Mood normal.        Behavior: Behavior normal.     Lab Results  Component Value Date   WBC 5.5 12/22/2023   HGB 15.7 12/22/2023   HCT 46.6 12/22/2023   PLT 215.0 12/22/2023   GLUCOSE 73 12/22/2023   CHOL 260 (H) 12/22/2023   TRIG 135.0 12/22/2023   HDL 34.10 (L) 12/22/2023   LDLCALC 199 (H) 12/22/2023   ALT 27 12/22/2023   AST 24 12/22/2023   NA 138 12/22/2023   K 3.9 12/22/2023   CL 101 12/22/2023   CREATININE 1.35 12/22/2023   BUN 20 12/22/2023   CO2 27 12/22/2023   TSH 1.16 12/22/2023   PSA 1.34 12/22/2023   INR 1.1 02/13/2019    DG Foot Complete Left Result Date: 08/03/2022 CLINICAL DATA:  Left foot pain for 1.5 weeks. Patient reports laceration to the foot but denies injury. No redness noted. Swelling noted at the great toe  proximal joint. No history of gout. EXAM: LEFT FOOT - COMPLETE 3+ VIEW COMPARISON:  None Available. FINDINGS: There is no evidence of fracture or dislocation. There is no evidence of significant arthropathy or cortical erosions. Tiny plantar calcaneal enthesophyte. Soft tissues are unremarkable. IMPRESSION: Negative. Electronically Signed   By: Leita Mattocks M.D.   On: 08/03/2022 11:53    The 10-year ASCVD risk score (Arnett DK, et al., 2019) is:  12.7%   Values used to calculate the score:     Age: 49 years     Clincally relevant sex: Male     Is Non-Hispanic African American: Yes     Diabetic: No     Tobacco smoker: No     Systolic Blood Pressure: 142 mmHg     Is BP treated: Yes     HDL Cholesterol: 34.1 mg/dL     Total Cholesterol: 260 mg/dL   Assessment & Plan:  Idiopathic chronic gout of foot without tophus, unspecified laterality -     Uric acid; Future -     Allopurinol ; Take 1 tablet (100 mg total) by mouth daily.  Dispense: 90 tablet; Refill: 0 -     Colchicine ; Take 1 tablet (0.6 mg total) by mouth 2 (two) times daily.  Dispense: 60 tablet; Refill: 1  Primary hypertension- He has not achieved his BP goal. -     Basic metabolic panel with GFR; Future -     CBC with Differential/Platelet; Future -     TSH; Future -     Urinalysis, Routine w reflex microscopic; Future -     Hepatic function panel; Future -     EKG 12-Lead -     Triamterene -HCTZ; Take 1 each (1 capsule total) by mouth daily. TAKE 1 EACH (1 CAPSULE TOTAL) BY MOUTH DAILY. Patient to follow up with PCP prior to future refills  Dispense: 90 capsule; Refill: 0 -     amLODIPine  Besylate; Take 1 tablet (5 mg total) by mouth daily.  Dispense: 90 tablet; Refill: 0  Dyslipidemia, goal LDL below 130- He has not achieved his LDL goal. -     Lipid panel; Future -     TSH; Future -     Hepatic function panel; Future -     Rosuvastatin  Calcium ; Take 1 tablet (20 mg total) by mouth daily.  Dispense: 90 tablet; Refill:  0  Encounter for general adult medical examination with abnormal findings -     PSA; Future  Somatic dysfunction of spine, cervical -     tiZANidine  HCl; Take 1 tablet (2 mg total) by mouth every 8 (eight) hours as needed for muscle spasms.  Dispense: 270 tablet; Refill: 0     Follow-up: Return in about 6 months (around 06/20/2024).  Debby Molt, MD

## 2023-12-22 NOTE — Patient Instructions (Signed)
 Health Maintenance, Male  Adopting a healthy lifestyle and getting preventive care are important in promoting health and wellness. Ask your health care provider about:  The right schedule for you to have regular tests and exams.  Things you can do on your own to prevent diseases and keep yourself healthy.  What should I know about diet, weight, and exercise?  Eat a healthy diet    Eat a diet that includes plenty of vegetables, fruits, low-fat dairy products, and lean protein.  Do not eat a lot of foods that are high in solid fats, added sugars, or sodium.  Maintain a healthy weight  Body mass index (BMI) is a measurement that can be used to identify possible weight problems. It estimates body fat based on height and weight. Your health care provider can help determine your BMI and help you achieve or maintain a healthy weight.  Get regular exercise  Get regular exercise. This is one of the most important things you can do for your health. Most adults should:  Exercise for at least 150 minutes each week. The exercise should increase your heart rate and make you sweat (moderate-intensity exercise).  Do strengthening exercises at least twice a week. This is in addition to the moderate-intensity exercise.  Spend less time sitting. Even light physical activity can be beneficial.  Watch cholesterol and blood lipids  Have your blood tested for lipids and cholesterol at 50 years of age, then have this test every 5 years.  You may need to have your cholesterol levels checked more often if:  Your lipid or cholesterol levels are high.  You are older than 50 years of age.  You are at high risk for heart disease.  What should I know about cancer screening?  Many types of cancers can be detected early and may often be prevented. Depending on your health history and family history, you may need to have cancer screening at various ages. This may include screening for:  Colorectal cancer.  Prostate cancer.  Skin cancer.  Lung  cancer.  What should I know about heart disease, diabetes, and high blood pressure?  Blood pressure and heart disease  High blood pressure causes heart disease and increases the risk of stroke. This is more likely to develop in people who have high blood pressure readings or are overweight.  Talk with your health care provider about your target blood pressure readings.  Have your blood pressure checked:  Every 3-5 years if you are 24-52 years of age.  Every year if you are 3 years old or older.  If you are between the ages of 60 and 72 and are a current or former smoker, ask your health care provider if you should have a one-time screening for abdominal aortic aneurysm (AAA).  Diabetes  Have regular diabetes screenings. This checks your fasting blood sugar level. Have the screening done:  Once every three years after age 66 if you are at a normal weight and have a low risk for diabetes.  More often and at a younger age if you are overweight or have a high risk for diabetes.  What should I know about preventing infection?  Hepatitis B  If you have a higher risk for hepatitis B, you should be screened for this virus. Talk with your health care provider to find out if you are at risk for hepatitis B infection.  Hepatitis C  Blood testing is recommended for:  Everyone born from [birth year] through 1967.  Anyone  with known risk factors for hepatitis C.  Sexually transmitted infections (STIs)  You should be screened each year for STIs, including gonorrhea and chlamydia, if:  You are sexually active and are younger than 50 years of age.  You are older than 50 years of age and your health care provider tells you that you are at risk for this type of infection.  Your sexual activity has changed since you were last screened, and you are at increased risk for chlamydia or gonorrhea. Ask your health care provider if you are at risk.  Ask your health care provider about whether you are at high risk for HIV. Your health care provider  may recommend a prescription medicine to help prevent HIV infection. If you choose to take medicine to prevent HIV, you should first get tested for HIV. You should then be tested every 3 months for as long as you are taking the medicine.  Follow these instructions at home:  Alcohol use  Do not drink alcohol if your health care provider tells you not to drink.  If you drink alcohol:  Limit how much you have to 0-2 drinks a day.  Know how much alcohol is in your drink. In the [place] one drink equals one 12 oz bottle of beer (355 mL), one 5 oz glass of wine (148 mL), or one 1 oz glass of hard liquor (44 mL).  Lifestyle  Do not use any products that contain nicotine or tobacco. These products include cigarettes, chewing tobacco, and vaping devices, such as e-cigarettes. If you need help quitting, ask your health care provider.  Do not use street drugs.  Do not share needles.  Ask your health care provider for help if you need support or information about quitting drugs.  General instructions  Schedule regular health, dental, and eye exams.  Stay current with your vaccines.  Tell your health care provider if:  You often feel depressed.  You have ever been abused or do not feel safe at home.  Summary  Adopting a healthy lifestyle and getting preventive care are important in promoting health and wellness.  Follow your health care provider's instructions about healthy diet, exercising, and getting tested or screened for diseases.  Follow your health care provider's instructions on monitoring your cholesterol and blood pressure.  This information is not intended to replace advice given to you by your health care provider. Make sure you discuss any questions you have with your health care provider.  Document Revised: 06/11/2020 Document Reviewed: 06/11/2020  Elsevier Patient Education  2024 [employer].

## 2023-12-23 LAB — CBC WITH DIFFERENTIAL/PLATELET
Basophils Absolute: 0 K/uL (ref 0.0–0.1)
Basophils Relative: 0.5 % (ref 0.0–3.0)
Eosinophils Absolute: 0.2 K/uL (ref 0.0–0.7)
Eosinophils Relative: 4 % (ref 0.0–5.0)
HCT: 46.6 % (ref 39.0–52.0)
Hemoglobin: 15.7 g/dL (ref 13.0–17.0)
Lymphocytes Relative: 41.6 % (ref 12.0–46.0)
Lymphs Abs: 2.3 K/uL (ref 0.7–4.0)
MCHC: 33.6 g/dL (ref 30.0–36.0)
MCV: 92 fl (ref 78.0–100.0)
Monocytes Absolute: 0.5 K/uL (ref 0.1–1.0)
Monocytes Relative: 9.8 % (ref 3.0–12.0)
Neutro Abs: 2.4 K/uL (ref 1.4–7.7)
Neutrophils Relative %: 44.1 % (ref 43.0–77.0)
Platelets: 215 K/uL (ref 150.0–400.0)
RBC: 5.07 Mil/uL (ref 4.22–5.81)
RDW: 13.3 % (ref 11.5–15.5)
WBC: 5.5 K/uL (ref 4.0–10.5)

## 2023-12-23 LAB — LIPID PANEL
Cholesterol: 260 mg/dL — ABNORMAL HIGH (ref 0–200)
HDL: 34.1 mg/dL — ABNORMAL LOW (ref >39.00)
LDL Cholesterol: 199 mg/dL — ABNORMAL HIGH (ref 0–99)
NonHDL: 226.24
Total CHOL/HDL Ratio: 8
Triglycerides: 135 mg/dL (ref 0.0–149.0)
VLDL: 27 mg/dL (ref 0.0–40.0)

## 2023-12-23 LAB — HEPATIC FUNCTION PANEL
ALT: 27 U/L (ref 0–53)
AST: 24 U/L (ref 0–37)
Albumin: 4.5 g/dL (ref 3.5–5.2)
Alkaline Phosphatase: 76 U/L (ref 39–117)
Bilirubin, Direct: 0.1 mg/dL (ref 0.0–0.3)
Total Bilirubin: 0.5 mg/dL (ref 0.2–1.2)
Total Protein: 7.8 g/dL (ref 6.0–8.3)

## 2023-12-23 LAB — BASIC METABOLIC PANEL WITH GFR
BUN: 20 mg/dL (ref 6–23)
CO2: 27 meq/L (ref 19–32)
Calcium: 9.7 mg/dL (ref 8.4–10.5)
Chloride: 101 meq/L (ref 96–112)
Creatinine, Ser: 1.35 mg/dL (ref 0.40–1.50)
GFR: 61.33 mL/min (ref >60.00)
Glucose, Bld: 73 mg/dL (ref 70–99)
Potassium: 3.9 meq/L (ref 3.5–5.1)
Sodium: 138 meq/L (ref 135–145)

## 2023-12-23 LAB — URIC ACID: Uric Acid, Serum: 10.7 mg/dL — ABNORMAL HIGH (ref 4.0–7.8)

## 2023-12-23 LAB — TSH: TSH: 1.16 u[IU]/mL (ref 0.35–5.50)

## 2023-12-23 LAB — PSA: PSA: 1.34 ng/mL (ref 0.10–4.00)

## 2023-12-23 MED ORDER — AMLODIPINE BESYLATE 5 MG PO TABS
5.0000 mg | ORAL_TABLET | Freq: Every day | ORAL | 0 refills | Status: AC
Start: 2023-12-23 — End: ?

## 2023-12-23 MED ORDER — ALLOPURINOL 100 MG PO TABS: 100.0000 mg | ORAL_TABLET | Freq: Every day | ORAL | 0 refills | Status: AC

## 2023-12-23 MED ORDER — TRIAMTERENE-HCTZ 37.5-25 MG PO CAPS: 1.0000 | ORAL_CAPSULE | Freq: Every day | ORAL | 0 refills | Status: AC

## 2023-12-23 MED ORDER — COLCHICINE 0.6 MG PO TABS: 0.6000 mg | ORAL_TABLET | Freq: Two times a day (BID) | ORAL | 1 refills | Status: AC

## 2023-12-23 MED ORDER — ROSUVASTATIN CALCIUM 20 MG PO TABS: 20.0000 mg | ORAL_TABLET | Freq: Every day | ORAL | 0 refills | Status: AC

## 2023-12-24 ENCOUNTER — Ambulatory Visit: Payer: Self-pay | Admitting: Internal Medicine

## 2023-12-24 LAB — URINALYSIS, ROUTINE W REFLEX MICROSCOPIC
Bilirubin Urine: NEGATIVE
Ketones, ur: NEGATIVE
Leukocytes,Ua: NEGATIVE
Nitrite: NEGATIVE
Specific Gravity, Urine: 1.015 (ref 1.000–1.030)
Total Protein, Urine: NEGATIVE
Urine Glucose: NEGATIVE
Urobilinogen, UA: 1 (ref 0.0–1.0)
pH: 7 (ref 5.0–8.0)

## 2023-12-25 ENCOUNTER — Telehealth: Payer: Self-pay | Admitting: *Deleted

## 2023-12-25 NOTE — Progress Notes (Unsigned)
 Care Guide Pharmacy Note  12/25/2023 Name: Travis Turner MRN: [id number] DOB: [birth year]  Referred By: Joshua Debby CROME, MD Reason for referral: Complex Care Management (Outreach to schedule referral with pharmacist )   Travis Turner is a 48 y.o. year old male who is a primary care patient of Joshua Debby CROME, MD.  Travis Turner was referred to the pharmacist for assistance related to: HTN  Will call pt back with pham schedule to verify time   Thedford Franks, CMA [facility]  [facility], [facility] Guide Direct Dial: [phone]  Fax: [phone] Website: [email or web address]

## 2023-12-29 NOTE — Progress Notes (Signed)
 Care Guide Pharmacy Note  12/29/2023 Name: Travis Turner MRN: [id number] DOB: 10-15-1975  Referred By: Joshua Debby CROME, MD Reason for referral: Complex Care Management (Outreach to schedule referral with pharmacist )   [name] is a 48 y.o. year old male who is a primary care patient of Joshua Debby CROME, MD.  Arlene Genova was referred to the pharmacist for assistance related to: HTN  Successful contact was made with the patient to discuss pharmacy services including being ready for the pharmacist to call at least 5 minutes before the scheduled appointment time and to have medication bottles and any blood pressure readings ready for review. The patient agreed to meet with the pharmacist via telephone visit on 01/13/2024  Thedford Franks, CMA [place]  [facility], [facility] Guide Direct Dial: [phone]  Fax: [phone] Website: [email or web address]

## 2024-01-13 ENCOUNTER — Other Ambulatory Visit: Payer: Self-pay | Admitting: Pharmacist

## 2024-01-13 DIAGNOSIS — E785 Hyperlipidemia, unspecified: Secondary | ICD-10-CM

## 2024-01-13 DIAGNOSIS — I10 Essential (primary) hypertension: Secondary | ICD-10-CM

## 2024-01-13 DIAGNOSIS — M1A0790 Idiopathic chronic gout, unspecified ankle and foot, without tophus (tophi): Secondary | ICD-10-CM

## 2024-01-13 NOTE — Progress Notes (Signed)
 01/13/2024 Name: Travis Turner MRN: [id number] DOB: 1975-05-19  Chief Complaint  Patient presents with   Hypertension   Hyperlipidemia   Gout   Medication Management    Travis Turner is a 48 y.o. year old male who presented for a telephone visit.   They were referred to the pharmacist by their PCP for assistance in managing hypertension, hyperlipidemia/cardiovascular risk reduction, and gout.    Subjective:  Care Team: Primary Care Provider: Joshua Debby CROME, MD ; Next Scheduled Visit: none scheduled  Medication Access/Adherence  Current Pharmacy:  [facility] [phone] [address], [place] - [address] [place] [address] Phone: [phone] Fax: [phone]   Patient reports affordability concerns with their medications: No  Patient reports access/transportation concerns to their pharmacy: No  Patient reports adherence concerns with their medications:  No    Currently does not have health insurance coverage. He did recently start a new job but is unsure about when or if health insurance will start. He was able to get all of his medications that were prescribed at last PCP visit.   Hypertension:  Current medications: Amlodipine  5 mg daily Medications previously tried: triamterene /hydrochlorothiazide   *Pt has not been taking triamterene /hydrochlorothiazide because he was not aware he was to continue it because the pharmacy did not fill it when he got his other prescriptions although an Rx was sent.  Prior to last appt, he was not taking triamterene /hydrochlorothiazide daily due to trying to stretch out his med supply. He also had a lot of stress due to being out of work.  Patient has a validated, automated, upper arm home BP cuff Current blood pressure readings readings: none recent   Hyperlipidemia/ASCVD Risk Reduction  Current lipid lowering medications: rosuvastatin  20 mg daily Medications tried in the past: none  Antiplatelet  regimen: none  ASCVD History: none Family History: CVA (father) Risk Factors: LDL >190   PREVENT Risk Score:  omesothelioma.fr - 10 year risk of CVD: 9.2% - 10 year risk of ASCVD: 6.7% - 10 year risk of HF: 2.1%  Gout: Current medications: allopurinol  100 mg daily, colchicine  0.6 mg twice daily  Pt notes he has reduced red met intake and mostly been eating Impossible meat and chicken or turkey. He thought his gout symptoms were returning but they improved with diet changes   Objective:  BP Readings from Last 3 Encounters:  12/22/23 (!) 142/104  08/03/22 128/84  07/14/22 138/84     No results found for: HGBA1C  Lab Results  Component Value Date   CREATININE 1.35 12/22/2023   BUN 20 12/22/2023   NA 138 12/22/2023   K 3.9 12/22/2023   CL 101 12/22/2023   CO2 27 12/22/2023    Lab Results  Component Value Date   CHOL 260 (H) 12/22/2023   HDL 34.10 (L) 12/22/2023   LDLCALC 199 (H) 12/22/2023   TRIG 135.0 12/22/2023   CHOLHDL 8 12/22/2023    Medications Reviewed Today     Reviewed by Merceda Lela SAUNDERS, RPH (Pharmacist) on 01/13/24 at 0827  Med List Status: <None>   Medication Order Taking? Sig Documenting Provider Last Dose Status Informant  allopurinol  (ZYLOPRIM ) 100 MG tablet [id number] Yes Take 1 tablet (100 mg total) by mouth daily. Joshua Debby CROME, MD  Active   amLODipine  (NORVASC ) 5 MG tablet [id number] Yes Take 1 tablet (5 mg total) by mouth daily. Joshua Debby CROME, MD  Active   colchicine  0.6 MG tablet [id number] Yes Take 1 tablet (0.6  mg total) by mouth 2 (two) times daily. Joshua Debby CROME, MD  Active   rosuvastatin  (CRESTOR ) 20 MG tablet [id number] Yes Take 1 tablet (20 mg total) by mouth daily. Joshua Debby CROME, MD  Active   tiZANidine  (ZANAFLEX ) 2 MG tablet [id number]  Take 1 tablet (2 mg total) by mouth every 8 (eight) hours as needed for muscle spasms. Joshua Debby CROME,  MD  Active   triamterene -hydrochlorothiazide (DYAZIDE) 37.5-25 MG capsule [id number]  Take 1 each (1 capsule total) by mouth daily. TAKE 1 EACH (1 CAPSULE TOTAL) BY MOUTH DAILY. Patient to follow up with PCP prior to future refills  Patient not taking: Reported on 01/13/2024   Joshua Debby CROME, MD  Active   VITAMIN D PO [id number]  Take 1 capsule by mouth daily. [provider]  Active Self              Assessment/Plan:   Hypertension: - Currently uncontrolled, BP goal <130/80 - Reviewed long term cardiovascular and renal outcomes of uncontrolled blood pressure - Reviewed appropriate blood pressure monitoring technique and reviewed goal blood pressure. Recommended to check home blood pressure and heart rate after restarting triamterene /hydrochlorothiazide to ensure BP is at goal and also not too low - Recommend to restart triamterene /hydrochlorothiazide and continue amlodipine   - If cost is an issue when he tries to get triamterene /hydrochlorothiazide, can be sent to [facility] for $10 for 30 DS  Hyperlipidemia/ASCVD Risk Reduction: - Currently uncontrolled. LDL goal <100 - Reviewed long term complications of uncontrolled cholesterol - Reviewed dietary recommendations including reduced red meat and increased fiber - Recommend to continue rosuvastatin     Gout: Currently uncontrolled, uric acid goal <6 Reviewed foods high in uric acid - red meat, shellfish/seafood, sweetened beverages, alcohol Continue colchicine  BID for at least 1-2 months while starting allopurinol , then can reduce to PRN for gout flares   Follow Up Plan: 2 weeks  Darrelyn Drum, PharmD, BCPS, CPP Clinical Pharmacist Practitioner [facility] [phone]

## 2024-01-13 NOTE — Patient Instructions (Signed)
 It was a pleasure speaking with you today!  Restart triamterene /hydrochlorothiazide and continue amlodipine . Check blood pressures at home for us  to review in a couple weeks.   Feel free to call with any questions or concerns!  Darrelyn Drum, PharmD, BCPS, CPP Clinical Pharmacist Practitioner [facility] [phone]

## 2024-01-18 ENCOUNTER — Other Ambulatory Visit: Payer: Self-pay | Admitting: Internal Medicine

## 2024-01-18 DIAGNOSIS — M1A0790 Idiopathic chronic gout, unspecified ankle and foot, without tophus (tophi): Secondary | ICD-10-CM

## 2024-01-25 ENCOUNTER — Other Ambulatory Visit: Payer: Self-pay

## 2024-02-15 IMAGING — CT CT ABD-PELV W/ CM
2 of 5 series · 15 of 46 positions shown, 17 images · IV contrast (Omni 300)
Comparison: Scrotal ultrasound earlier today was reviewed.

CLINICAL DATA: LLQ abdominal pain Concern for left testicular pain
that radiated to the groin region. Concern for possible hernia or
torsion.

EXAM:
CT ABDOMEN AND PELVIS WITH CONTRAST
TECHNIQUE: Multidetector CT imaging of the abdomen and pelvis was performed
using the standard protocol following bolus administration of
intravenous contrast.

[Series 3: a/p w/ 5mm · axial · 0.94mm/px · z∈[+758,+1263]mm · 12 of 113 slices shown, 14 images]
[im 6/113  soft-tissue]
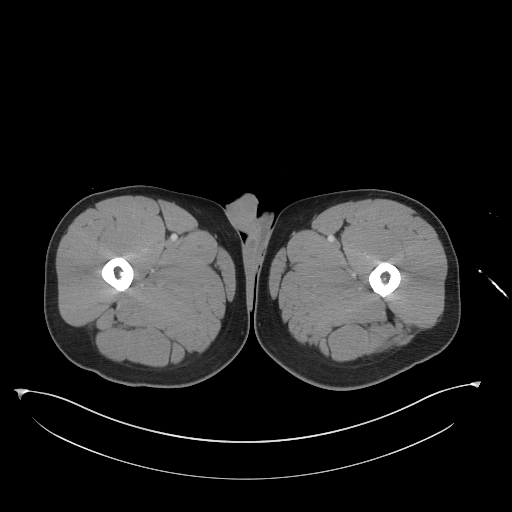
[im 6/113  bone]
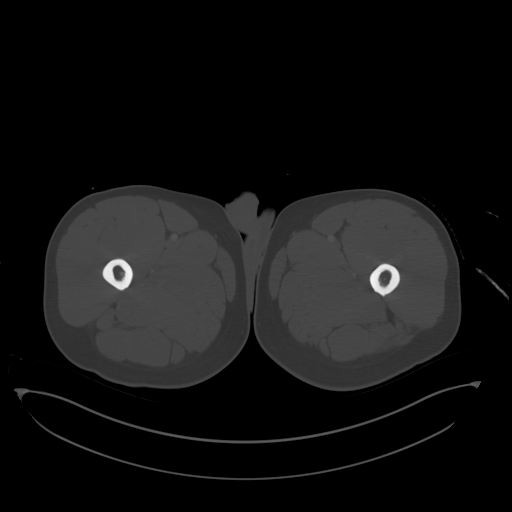
[im 17/113  soft-tissue]
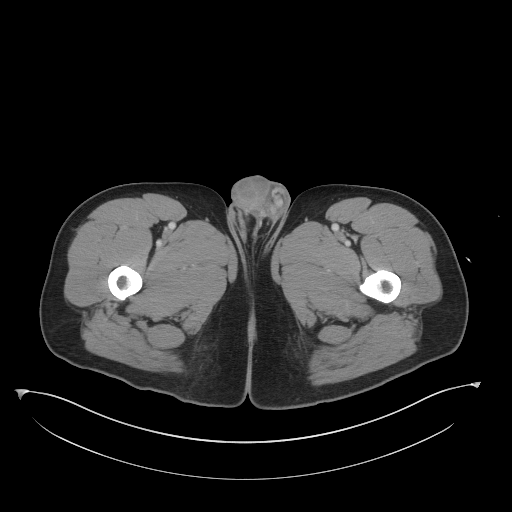
[im 23/113  soft-tissue]
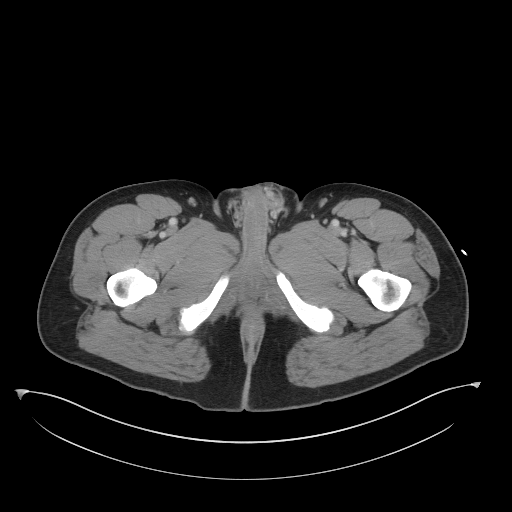
[im 34/113  soft-tissue]
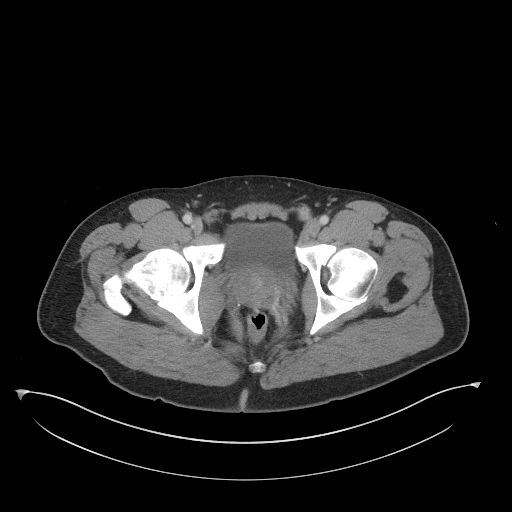
[im 45/113  soft-tissue]
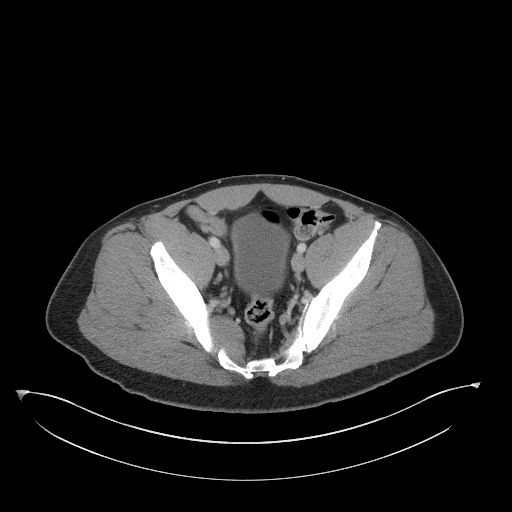
[im 51/113  soft-tissue]
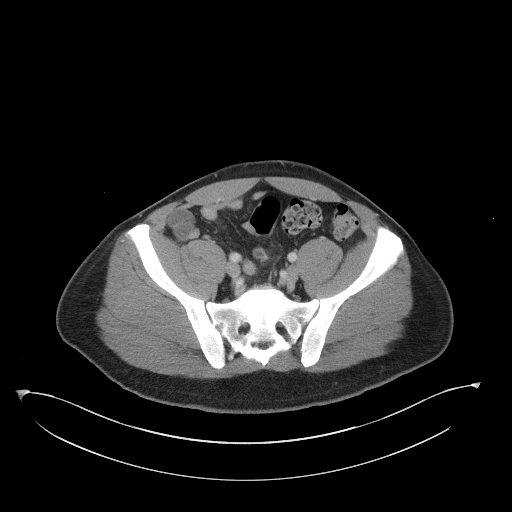
[im 62/113  soft-tissue]
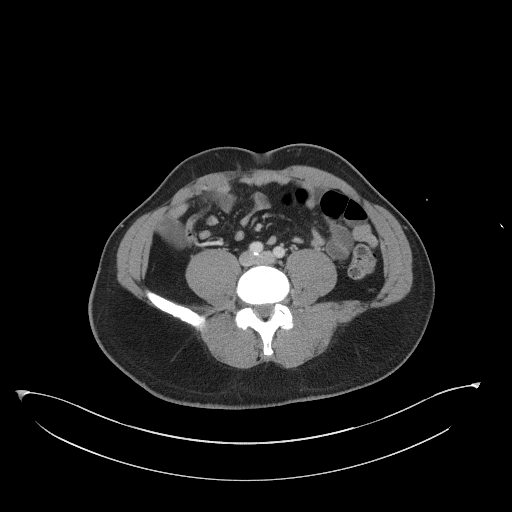
[im 68/113  soft-tissue]
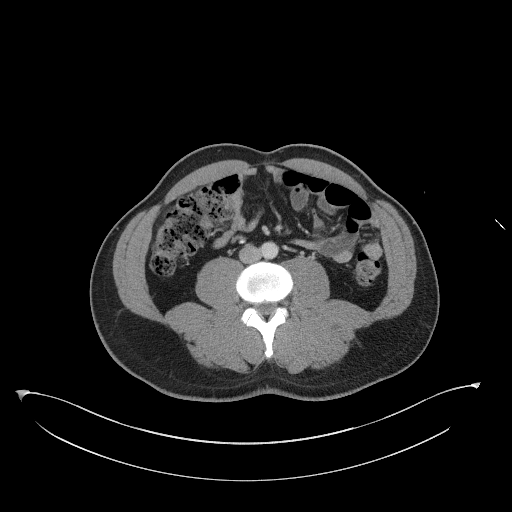
[im 79/113  soft-tissue]
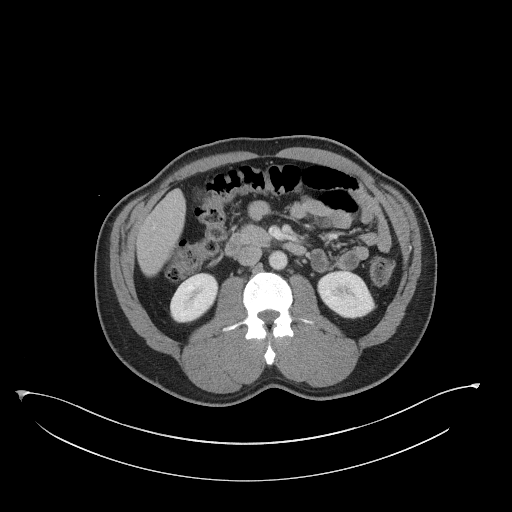
[im 79/113  bone]
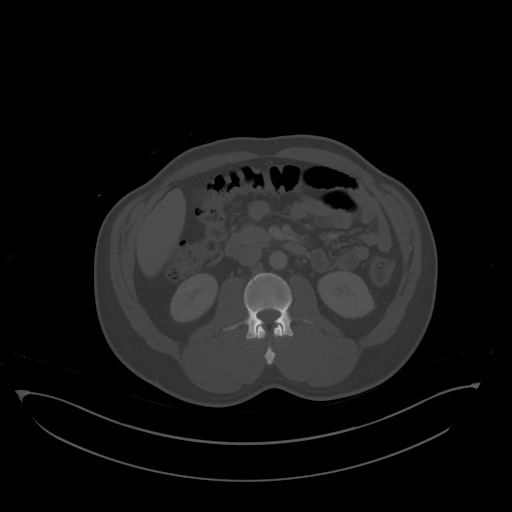
[im 90/113  soft-tissue]
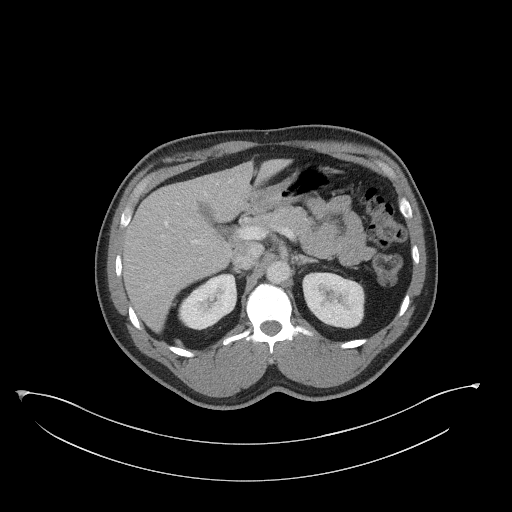
[im 96/113  soft-tissue]
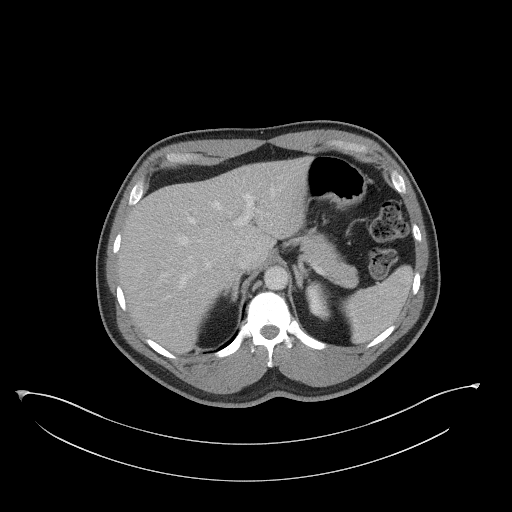
[im 107/113  soft-tissue]
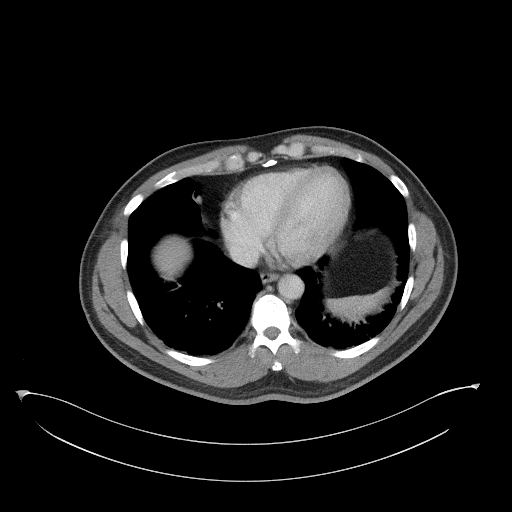

[Series 6: a/p w/ cor · coronal · 0.81mm/px · 3 of 136 slices shown]
[im 46/136  soft-tissue]
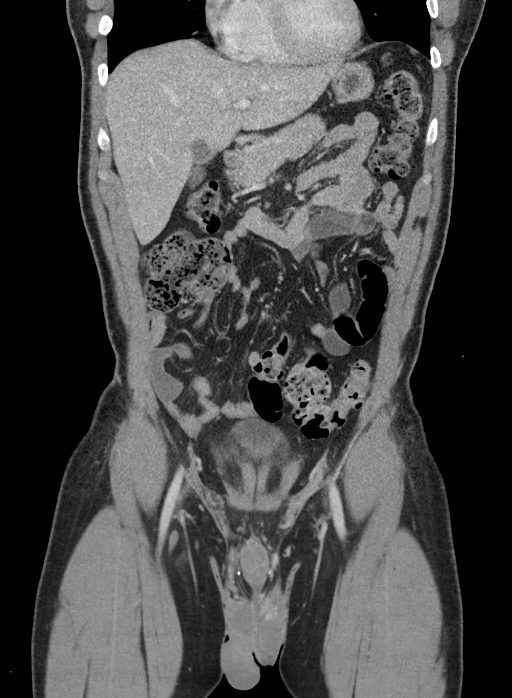
[im 61/136  soft-tissue]
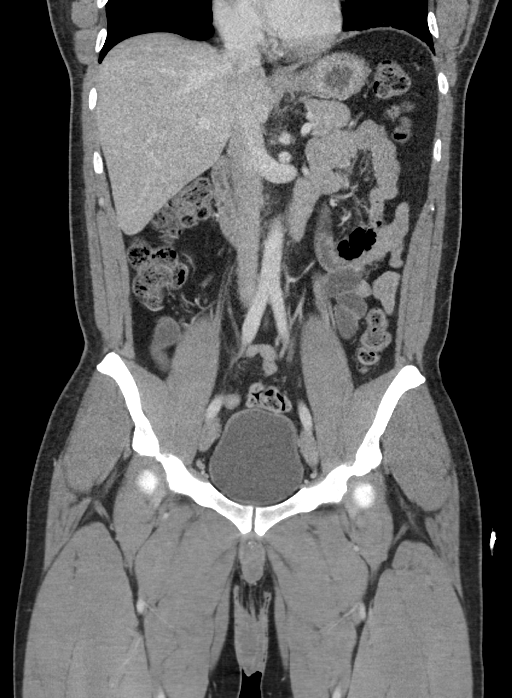
[im 76/136  soft-tissue]
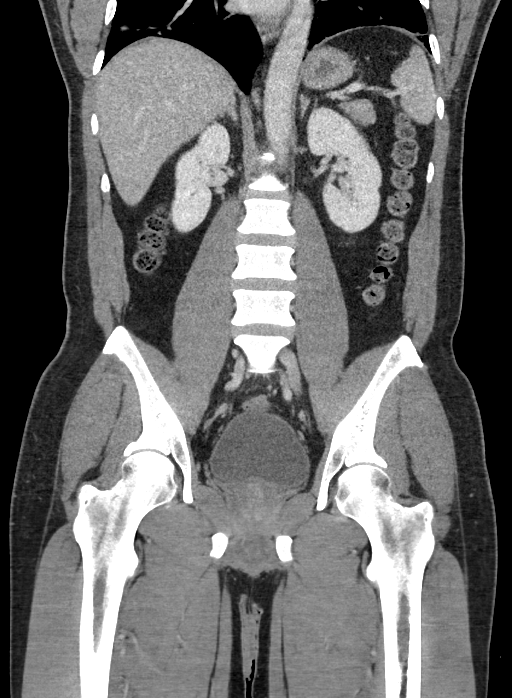

[15 of 46 positions shown; findings below may reference images not displayed]

RADIATION DOSE REDUCTION: This exam was performed according to the
departmental dose-optimization program which includes automated
exposure control, adjustment of the mA and/or kV according to
patient size and/or use of iterative reconstruction technique.

CONTRAST:  100mL OMNIPAQUE IOHEXOL 300 MG/ML  SOLN
FINDINGS: Lower chest: Dependent and subsegmental atelectasis within both
lower lobes. No significant pleural effusion.

Hepatobiliary: 9 mm enhancing focus in the subcapsular right hepatic
lobe, series 3, image 11. Tiny subcentimeter hypodensity in the left
lobe is too small to characterize. Gallbladder physiologically
distended, no calcified stone. No biliary dilatation.

Pancreas: No ductal dilatation or inflammation.

Spleen: Normal in size without focal abnormality.

Adrenals/Urinary Tract: Normal adrenal glands. No hydronephrosis or
perinephric edema. Homogeneous renal enhancement no focal renal
lesion or renal stone. Urinary bladder is physiologically distended
without wall thickening.

Stomach/Bowel: Patulous distal esophagus. Unremarkable stomach. No
small bowel obstruction or inflammation. Normal appendix. Cecum is
high-riding in the right mid abdomen. Small to moderate volume of
colonic stool. There is no colonic wall thickening or pericolonic
edema.

Vascular/Lymphatic: Normal caliber abdominal aorta. Minimal
atherosclerosis. Patent portal vein. No abdominopelvic adenopathy.

Reproductive: Unremarkable prostate gland. There is hyperemia and
increased vascularity of the left pampiniform plexus in the inguinal
canal extending into the left hemiscrotum, suspicious for
epididymitis, for example series 3, image 96 and 98.

Other: No inguinal hernia.  No ascites or free air.

Musculoskeletal: Lower lumbar facet hypertrophy. There are no acute
or suspicious osseous abnormalities. Partial ankylosis of the left
sacroiliac joint.
IMPRESSION: 1. Hyperemia and increased vascularity of the left pampiniform
plexus in the inguinal canal extending into the left hemiscrotum,
suspicious for epididymitis.
2. No inguinal hernia.
3. Enhancing 9 mm focus in the subcapsular right hepatic lobe,
nonspecific. In a low risk patient this is typically benign and no
further follow-up is recommended. In a high-risk patient, recommend
follow-up MRI in 3-6 months. This recommendation follows the
recommendations of [HOSPITAL] assessment of
incidental liver lesions.

Aortic Atherosclerosis (3ODHT-[email or web address].C).
